# Patient Record
Sex: Female | Born: 1985
Health system: Southern US, Community
[De-identification: ages and names within clinical notes are randomized; demographics above are authoritative.]

## PROBLEM LIST (undated history)

## (undated) DIAGNOSIS — D649 Anemia, unspecified: Secondary | ICD-10-CM

## (undated) DIAGNOSIS — I10 Essential (primary) hypertension: Secondary | ICD-10-CM

## (undated) HISTORY — DX: Essential (primary) hypertension: I10

## (undated) HISTORY — PX: HERNIA REPAIR: SHX51

## (undated) HISTORY — DX: Anemia, unspecified: D64.9

## (undated) HISTORY — PX: TUBAL LIGATION: SHX77

---

## 2014-09-18 ENCOUNTER — Emergency Department (HOSPITAL_COMMUNITY)
Admission: EM | Admit: 2014-09-18 | Discharge: 2014-09-18 | Discharge status: Against Medical Advice | Payer: Self-pay | Attending: Emergency Medicine | Admitting: Emergency Medicine

## 2014-09-18 ENCOUNTER — Encounter (HOSPITAL_COMMUNITY): Payer: Self-pay | Admitting: Emergency Medicine

## 2014-09-18 DIAGNOSIS — Z720 Tobacco use: Secondary | ICD-10-CM | POA: Insufficient documentation

## 2014-09-18 DIAGNOSIS — R300 Dysuria: Secondary | ICD-10-CM | POA: Insufficient documentation

## 2014-09-18 DIAGNOSIS — N898 Other specified noninflammatory disorders of vagina: Secondary | ICD-10-CM | POA: Insufficient documentation

## 2014-09-18 NOTE — ED Notes (Signed)
Pt c/o dysuria, vaginal discharge, and lower abdominal pain starting last night.  Pain score 6/10.  Pt reports boyfriend was recently diagnosed w/ dysuria and urethritis.  Sts "I wanna be given what he was."

## 2014-09-18 NOTE — ED Notes (Signed)
No answer x 3 in WR 

## 2014-09-18 NOTE — ED Notes (Signed)
Called for second time without response 

## 2014-09-18 NOTE — ED Notes (Signed)
Called to take to a treatment room without response from lobby

## 2014-09-18 NOTE — ED Notes (Signed)
Called for third time without response from lobby 

## 2014-09-30 ENCOUNTER — Encounter (HOSPITAL_COMMUNITY): Payer: Self-pay

## 2014-09-30 ENCOUNTER — Emergency Department (HOSPITAL_COMMUNITY)
Admission: EM | Admit: 2014-09-30 | Discharge: 2014-10-01 | Discharge status: Against Medical Advice | Payer: Self-pay | Attending: Emergency Medicine | Admitting: Emergency Medicine

## 2014-09-30 DIAGNOSIS — Z202 Contact with and (suspected) exposure to infections with a predominantly sexual mode of transmission: Secondary | ICD-10-CM | POA: Insufficient documentation

## 2014-09-30 DIAGNOSIS — Z8679 Personal history of other diseases of the circulatory system: Secondary | ICD-10-CM | POA: Insufficient documentation

## 2014-09-30 DIAGNOSIS — Z720 Tobacco use: Secondary | ICD-10-CM | POA: Insufficient documentation

## 2014-09-30 DIAGNOSIS — R519 Headache, unspecified: Secondary | ICD-10-CM

## 2014-09-30 DIAGNOSIS — Z113 Encounter for screening for infections with a predominantly sexual mode of transmission: Secondary | ICD-10-CM

## 2014-09-30 DIAGNOSIS — Z3202 Encounter for pregnancy test, result negative: Secondary | ICD-10-CM | POA: Insufficient documentation

## 2014-09-30 DIAGNOSIS — R51 Headache: Secondary | ICD-10-CM | POA: Insufficient documentation

## 2014-09-30 LAB — COMPREHENSIVE METABOLIC PANEL
ALT: 11 U/L (ref 0–35)
ANION GAP: 12 (ref 5–15)
AST: 14 U/L (ref 0–37)
Albumin: 3.8 g/dL (ref 3.5–5.2)
Alkaline Phosphatase: 62 U/L (ref 39–117)
BUN: 21 mg/dL (ref 6–23)
CALCIUM: 9.4 mg/dL (ref 8.4–10.5)
CO2: 24 meq/L (ref 19–32)
Chloride: 104 mEq/L (ref 96–112)
Creatinine, Ser: 1.39 mg/dL — ABNORMAL HIGH (ref 0.50–1.10)
GFR calc Af Amer: 58 mL/min — ABNORMAL LOW (ref >90)
GFR, EST NON AFRICAN AMERICAN: 50 mL/min — AB (ref >90)
GLUCOSE: 83 mg/dL (ref 70–99)
Potassium: 4.1 mEq/L (ref 3.7–5.3)
Sodium: 140 mEq/L (ref 137–147)
TOTAL PROTEIN: 7.2 g/dL (ref 6.0–8.3)
Total Bilirubin: <0.2 mg/dL — ABNORMAL LOW (ref 0.3–1.2)

## 2014-09-30 LAB — CBC
HEMATOCRIT: 36.8 % (ref 36.0–46.0)
HEMOGLOBIN: 12.2 g/dL (ref 12.0–15.0)
MCH: 28.6 pg (ref 26.0–34.0)
MCHC: 33.2 g/dL (ref 30.0–36.0)
MCV: 86.2 fL (ref 78.0–100.0)
Platelets: 282 10*3/uL (ref 150–400)
RBC: 4.27 MIL/uL (ref 3.87–5.11)
RDW: 14.2 % (ref 11.5–15.5)
WBC: 9.7 10*3/uL (ref 4.0–10.5)

## 2014-09-30 LAB — POC URINE PREG, ED: Preg Test, Ur: NEGATIVE

## 2014-09-30 LAB — WET PREP, GENITAL
Clue Cells Wet Prep HPF POC: NONE SEEN
Trich, Wet Prep: NONE SEEN
Yeast Wet Prep HPF POC: NONE SEEN

## 2014-09-30 MED ORDER — METOCLOPRAMIDE HCL 5 MG/ML IJ SOLN
10.0000 mg | Freq: Once | INTRAMUSCULAR | Status: AC
  Administered 2014-09-30: 10 mg via INTRAVENOUS
  Filled 2014-09-30: qty 2

## 2014-09-30 MED ORDER — SODIUM CHLORIDE 0.9 % IV BOLUS (SEPSIS)
1000.0000 mL | Freq: Once | INTRAVENOUS | Status: AC
Start: 2014-09-30 — End: 2014-10-01
  Administered 2014-10-01: 1000 mL via INTRAVENOUS

## 2014-09-30 MED ORDER — AZITHROMYCIN 250 MG PO TABS
1000.0000 mg | ORAL_TABLET | Freq: Once | ORAL | Status: AC
  Administered 2014-09-30: 1000 mg via ORAL
  Filled 2014-09-30: qty 4

## 2014-09-30 MED ORDER — CEFTRIAXONE SODIUM 250 MG IJ SOLR
250.0000 mg | Freq: Once | INTRAMUSCULAR | Status: AC
  Administered 2014-09-30: 250 mg via INTRAMUSCULAR
  Filled 2014-09-30: qty 250

## 2014-09-30 MED ORDER — KETOROLAC TROMETHAMINE 30 MG/ML IJ SOLN: 30.0000 mg | Freq: Once | INTRAMUSCULAR | Status: DC

## 2014-09-30 MED ORDER — DIPHENHYDRAMINE HCL 50 MG/ML IJ SOLN
25.0000 mg | Freq: Once | INTRAMUSCULAR | Status: AC
  Administered 2014-09-30: 25 mg via INTRAVENOUS
  Filled 2014-09-30: qty 1

## 2014-09-30 MED ORDER — LIDOCAINE HCL 2 % IJ SOLN
INTRAMUSCULAR | Status: AC
  Administered 2014-09-30: 3 mL
  Filled 2014-09-30: qty 20

## 2014-09-30 NOTE — ED Notes (Signed)
Pt states she is currently unable to urinate. Says she will try again after pelvic exam.

## 2014-09-30 NOTE — ED Notes (Signed)
Pt states that her boyfriend was dx with gonorrhea and she wants to be checked, she states that she's having some abd pain and discharge, she also complains of a headache

## 2014-09-30 NOTE — ED Notes (Signed)
Pelvic supplies at bedside. 

## 2014-09-30 NOTE — ED Provider Notes (Signed)
CSN: [id number]     Arrival date & time 09/30/14  1923 History   First MD Initiated Contact with Patient 09/30/14 2255     Chief Complaint  Patient presents with  . Abdominal Pain  . Headache     (Consider location/radiation/quality/duration/timing/severity/associated sxs/prior Treatment) HPI Comments: Patient presents emergency department with chief complaint of an STD check and headache.  Headache: Patient states that the headache started about 3 days ago. She states it came on gradually and has progressively worsened. She reports a history of migraines. She states that this feels similar. She has tried OTC medicines with no relief. She denies any fevers, chills, nausea, or vomiting. There are no aggravating or alleviating factors. She is concerned that her blood pressure was running high.  STD check: Patient states that her boyfriend was recently diagnosed with gonorrhea. She states that she wants to be checked for the same. She reports small amount of vaginal discharge. She denies specific abdominal pain. She denies any fevers, chills, nausea, vomiting. She has not taken anything to alleviate the symptoms.  The history is provided by the patient. No language interpreter was used.    History reviewed. No pertinent past medical history. Past Surgical History  Procedure Laterality Date  . Hernia repair     History reviewed. No pertinent family history. History  Substance Use Topics  . Smoking status: Current Every Day Smoker -- 0.50 packs/day    Types: Cigarettes  . Smokeless tobacco: Not on file  . Alcohol Use: Yes     Comment: occ   OB History    No data available     Review of Systems  Constitutional: Negative for fever and chills.  Respiratory: Negative for shortness of breath.   Cardiovascular: Negative for chest pain.  Gastrointestinal: Negative for nausea, vomiting, diarrhea and constipation.  Genitourinary: Positive for vaginal discharge. Negative for dysuria.   Neurological: Positive for headaches.  All other systems reviewed and are negative.     Allergies  Review of patient's allergies indicates no known allergies.  Home Medications   Prior to Admission medications   Medication Sig Start Date End Date Taking? Authorizing Provider  ibuprofen (ADVIL,MOTRIN) 200 MG tablet Take 400 mg by mouth every 6 (six) hours as needed for headache.   Yes Historical Provider, MD  Pseudoeph-Doxylamine-DM-APAP (NYQUIL PO) Take 1 capsule by mouth at bedtime.   Yes Historical Provider, MD   BP 140/90 mmHg  Pulse 62  Temp(Src) 97.5 F (36.4 C) (Oral)  Resp 18  Ht 5\' 7"  (1.702 m)  Wt 170 lb (77.111 kg)  BMI 26.62 kg/m2  SpO2 100%  LMP 09/04/2014 Physical Exam  Constitutional: She is oriented to person, place, and time. She appears well-developed and well-nourished.  HENT:  Head: Normocephalic and atraumatic.  Right Ear: External ear normal.  Left Ear: External ear normal.  Eyes: Conjunctivae and EOM are normal. Pupils are equal, round, and reactive to light.  Neck: Normal range of motion. Neck supple.  No pain with neck flexion, no meningismus  Cardiovascular: Normal rate, regular rhythm and normal heart sounds.  Exam reveals no gallop and no friction rub.   No murmur heard. Pulmonary/Chest: Effort normal and breath sounds normal. No respiratory distress. She has no wheezes. She has no rales. She exhibits no tenderness.  Abdominal: Soft. She exhibits no distension and no mass. There is no tenderness. There is no rebound and no guarding.  Musculoskeletal: Normal range of motion. She exhibits no edema or tenderness.  Normal  gait.  Neurological: She is alert and oriented to person, place, and time. She has normal reflexes.  CN 3-12 intact, normal finger to nose, no pronator drift, sensation and strength intact bilaterally.  Skin: Skin is warm and dry.  Psychiatric: She has a normal mood and affect. Her behavior is normal. Judgment and thought content  normal.  Nursing note and vitals reviewed.   ED Course  Procedures (including critical care time) Labs Review Labs Reviewed  COMPREHENSIVE METABOLIC PANEL - Abnormal; Notable for the following:    Creatinine, Ser 1.39 (*)    Total Bilirubin <0.2 (*)    GFR calc non Af Amer 50 (*)    GFR calc Af Amer 58 (*)    All other components within normal limits  GC/CHLAMYDIA PROBE AMP  WET PREP, GENITAL  CBC  RPR  URINALYSIS, ROUTINE W REFLEX MICROSCOPIC  POC URINE PREG, ED    Imaging Review No results found.   EKG Interpretation None      MDM   Final diagnoses:  Nonintractable headache, unspecified chronicity pattern, unspecified headache type  Screen for STD (sexually transmitted disease)   Patient here for STD check. Also complaining of headache. Treat with migraine cocktail. Plan for treatment for STD, as the boyfriend was recently diagnosed gonorrhea.  11:45 PM Creatinine is mildly elevated, will hold Toradol.  12:33 AM Returned to re-evaluate patient.  Patient had eloped.  I was never notified of the patient leaving.    Montine Circle, PA-C 10/01/14 [phone]

## 2014-10-01 LAB — URINALYSIS, ROUTINE W REFLEX MICROSCOPIC
BILIRUBIN URINE: NEGATIVE
Glucose, UA: NEGATIVE mg/dL
Hgb urine dipstick: NEGATIVE
Ketones, ur: NEGATIVE mg/dL
Nitrite: NEGATIVE
PH: 6.5 (ref 5.0–8.0)
Protein, ur: NEGATIVE mg/dL
Specific Gravity, Urine: 1.022 (ref 1.005–1.030)
UROBILINOGEN UA: 0.2 mg/dL (ref 0.0–1.0)

## 2014-10-01 LAB — URINE MICROSCOPIC-ADD ON

## 2014-10-01 LAB — GC/CHLAMYDIA PROBE AMP
CT Probe RNA: NEGATIVE
GC PROBE AMP APTIMA: POSITIVE — AB

## 2014-10-01 LAB — RPR

## 2014-10-01 NOTE — ED Notes (Signed)
Pt decided to leave prior to proper discharge instructions.

## 2014-10-01 NOTE — ED Notes (Signed)
IV removed, pt states she is not willing to wait for discharge paperwork and left AMA

## 2014-10-02 ENCOUNTER — Telehealth: Payer: Self-pay | Admitting: Emergency Medicine

## 2014-10-02 NOTE — Telephone Encounter (Signed)
Positive Gonorrhea Treated with Rocephin and Zithromax per protocol MD [facility] faxed  10/02/14 @ 1225 - patient notified

## 2015-02-20 ENCOUNTER — Ambulatory Visit: Payer: Self-pay

## 2015-03-05 ENCOUNTER — Emergency Department (HOSPITAL_COMMUNITY)
Admission: EM | Admit: 2015-03-05 | Discharge: 2015-03-05 | Disposition: A | Discharge status: Home / Self Care | Payer: Self-pay | Attending: Emergency Medicine | Admitting: Emergency Medicine

## 2015-03-05 ENCOUNTER — Encounter (HOSPITAL_COMMUNITY): Payer: Self-pay

## 2015-03-05 DIAGNOSIS — H6122 Impacted cerumen, left ear: Secondary | ICD-10-CM | POA: Insufficient documentation

## 2015-03-05 DIAGNOSIS — Z720 Tobacco use: Secondary | ICD-10-CM | POA: Insufficient documentation

## 2015-03-05 DIAGNOSIS — R05 Cough: Secondary | ICD-10-CM | POA: Insufficient documentation

## 2015-03-05 NOTE — Discharge Instructions (Signed)
Cerumen Impaction °A cerumen impaction is when the wax in your ear forms a plug. This plug usually causes reduced hearing. Sometimes it also causes an earache or dizziness. Removing a cerumen impaction can be difficult and painful. The wax sticks to the ear canal. The canal is sensitive and bleeds easily. If you try to remove a heavy wax buildup with a cotton tipped swab, you may push it in further. °Irrigation with water, suction, and small ear curettes may be used to clear out the wax. If the impaction is fixed to the skin in the ear canal, ear drops may be needed for a few days to loosen the wax. People who build up a lot of wax frequently can use ear wax removal products available in your local drugstore. °SEEK MEDICAL CARE IF:  °You develop an earache, increased hearing loss, or marked dizziness. °Document Released: 12/23/2004 Document Revised: 02/07/2012 Document Reviewed: 02/12/2010 °[employer]® Patient Information ©2015 [employer]. This information is not intended to replace advice given to you by your health care provider. Make sure you discuss any questions you have with your health care provider. ° °

## 2015-03-05 NOTE — ED Notes (Signed)
Patient c/o left ear pain that radiates into the left side of her face x 1 week.

## 2015-03-05 NOTE — ED Provider Notes (Signed)
CSN: [id number]     Arrival date & time 03/05/15  1807 History   First MD Initiated Contact with Patient 03/05/15 1828     Chief Complaint  Patient presents with  . Otalgia   Patient is a 29 y.o. female presenting with ear pain. The history is provided by the patient. No language interpreter was used.  Otalgia Associated symptoms: cough    This chart was scribed for nurse practitioner Glendell Docker, NP working with Virgel Manifold, MD, by Thea Alken, ED Scribe. This patient was seen in room WTR8/WTR8 and the patient's care was started at 6:31 PM.  Theresa Arias is a 29 y.o. female who presents to the Emergency Department complaining of worsening left otalgia that began 1 week. Pt reports she's had otalgia for 1 week that worsened today. She states when lying on her left side, it feels as if left ear is draining. She reports associated cough but denies fever and sore throat.   History reviewed. No pertinent past medical history. Past Surgical History  Procedure Laterality Date  . Hernia repair     Family History  Problem Relation Age of Onset  . Hypertension Mother   . Hypertension Father    History  Substance Use Topics  . Smoking status: Current Every Day Smoker -- 0.50 packs/day    Types: Cigarettes  . Smokeless tobacco: Not on file  . Alcohol Use: Yes     Comment: occ   OB History    No data available     Review of Systems  HENT: Positive for ear pain.   Respiratory: Positive for cough.   All other systems reviewed and are negative.  Allergies  Review of patient's allergies indicates no known allergies.  Home Medications   Prior to Admission medications   Medication Sig Start Date End Date Taking? Authorizing Provider  ibuprofen (ADVIL,MOTRIN) 200 MG tablet Take 400 mg by mouth every 6 (six) hours as needed for headache.    Historical Provider, MD  Pseudoeph-Doxylamine-DM-APAP (NYQUIL PO) Take 1 capsule by mouth at bedtime.    Historical Provider, MD   BP  131/90 mmHg  Pulse 80  Temp(Src) 98.1 F (36.7 C) (Oral)  Resp 18  SpO2 98%  LMP 02/16/2015 Physical Exam  Constitutional: She is oriented to person, place, and time. She appears well-developed and well-nourished. No distress.  HENT:  Head: Normocephalic and atraumatic.  Right Ear: External ear normal.  Cerumen impaction noted to the left ear  Eyes: Conjunctivae and EOM are normal.  Neck: Neck supple.  Cardiovascular: Normal rate.   Pulmonary/Chest: Effort normal.  Musculoskeletal: Normal range of motion.  Neurological: She is alert and oriented to person, place, and time.  Skin: Skin is warm and dry.  Psychiatric: She has a normal mood and affect. Her behavior is normal.  Nursing note and vitals reviewed.   ED Course  Procedures  DIAGNOSTIC STUDIES: Oxygen Saturation is 98% on RA, normal by my interpretation.    COORDINATION OF CARE: 6:36 PM- Pt advised of plan for treatment left ear irrigation and pt agrees.  Labs Review Labs Reviewed - No data to display  Imaging Review No results found.   EKG Interpretation None      MDM   Final diagnoses:  Cerumen impaction, left    Nurse cleaned out tm clear. Discussed treatment at home  I personally performed the services described in this documentation, which was scribed in my presence. The recorded information has been reviewed and is accurate.  Glendell Docker, NP 03/05/15 1901  Virgel Manifold, MD 03/05/15 [phone]

## 2015-06-24 ENCOUNTER — Emergency Department (HOSPITAL_COMMUNITY)
Admission: EM | Admit: 2015-06-24 | Discharge: 2015-06-24 | Disposition: A | Discharge status: Home / Self Care | Payer: Self-pay | Attending: Emergency Medicine | Admitting: Emergency Medicine

## 2015-06-24 ENCOUNTER — Encounter (HOSPITAL_COMMUNITY): Payer: Self-pay | Admitting: Emergency Medicine

## 2015-06-24 DIAGNOSIS — N939 Abnormal uterine and vaginal bleeding, unspecified: Secondary | ICD-10-CM | POA: Insufficient documentation

## 2015-06-24 DIAGNOSIS — Z720 Tobacco use: Secondary | ICD-10-CM | POA: Insufficient documentation

## 2015-06-24 DIAGNOSIS — A5901 Trichomonal vulvovaginitis: Secondary | ICD-10-CM | POA: Insufficient documentation

## 2015-06-24 DIAGNOSIS — Z3202 Encounter for pregnancy test, result negative: Secondary | ICD-10-CM | POA: Insufficient documentation

## 2015-06-24 DIAGNOSIS — A599 Trichomoniasis, unspecified: Secondary | ICD-10-CM

## 2015-06-24 LAB — URINALYSIS, ROUTINE W REFLEX MICROSCOPIC
BILIRUBIN URINE: NEGATIVE
Glucose, UA: NEGATIVE mg/dL
Ketones, ur: NEGATIVE mg/dL
NITRITE: NEGATIVE
PH: 8 (ref 5.0–8.0)
Protein, ur: NEGATIVE mg/dL
Specific Gravity, Urine: 1.016 (ref 1.005–1.030)
UROBILINOGEN UA: 0.2 mg/dL (ref 0.0–1.0)

## 2015-06-24 LAB — CBC WITH DIFFERENTIAL/PLATELET
BASOS ABS: 0 10*3/uL (ref 0.0–0.1)
BASOS PCT: 0 % (ref 0–1)
Eosinophils Absolute: 0.4 10*3/uL (ref 0.0–0.7)
Eosinophils Relative: 4 % (ref 0–5)
HCT: 38.9 % (ref 36.0–46.0)
HEMOGLOBIN: 12.5 g/dL (ref 12.0–15.0)
LYMPHS ABS: 2 10*3/uL (ref 0.7–4.0)
Lymphocytes Relative: 20 % (ref 12–46)
MCH: 27.5 pg (ref 26.0–34.0)
MCHC: 32.1 g/dL (ref 30.0–36.0)
MCV: 85.7 fL (ref 78.0–100.0)
Monocytes Absolute: 0.7 10*3/uL (ref 0.1–1.0)
Monocytes Relative: 7 % (ref 3–12)
NEUTROS ABS: 6.8 10*3/uL (ref 1.7–7.7)
Neutrophils Relative %: 69 % (ref 43–77)
Platelets: 270 10*3/uL (ref 150–400)
RBC: 4.54 MIL/uL (ref 3.87–5.11)
RDW: 14.4 % (ref 11.5–15.5)
WBC: 9.9 10*3/uL (ref 4.0–10.5)

## 2015-06-24 LAB — BASIC METABOLIC PANEL
Anion gap: 7 (ref 5–15)
BUN: 13 mg/dL (ref 6–20)
CHLORIDE: 104 mmol/L (ref 101–111)
CO2: 24 mmol/L (ref 22–32)
CREATININE: 0.86 mg/dL (ref 0.44–1.00)
Calcium: 8.9 mg/dL (ref 8.9–10.3)
GFR calc non Af Amer: >60 mL/min (ref >60)
Glucose, Bld: 90 mg/dL (ref 65–99)
POTASSIUM: 4 mmol/L (ref 3.5–5.1)
Sodium: 135 mmol/L (ref 135–145)

## 2015-06-24 LAB — WET PREP, GENITAL: Yeast Wet Prep HPF POC: NONE SEEN

## 2015-06-24 LAB — URINE MICROSCOPIC-ADD ON

## 2015-06-24 LAB — POC URINE PREG, ED: Preg Test, Ur: NEGATIVE

## 2015-06-24 MED ORDER — METRONIDAZOLE 500 MG PO TABS
2000.0000 mg | ORAL_TABLET | Freq: Once | ORAL | Status: AC
  Administered 2015-06-24: 2000 mg via ORAL
  Filled 2015-06-24: qty 4

## 2015-06-24 MED ORDER — ONDANSETRON HCL 4 MG/2ML IJ SOLN
4.0000 mg | Freq: Once | INTRAMUSCULAR | Status: AC
  Administered 2015-06-24: 4 mg via INTRAVENOUS
  Filled 2015-06-24: qty 2

## 2015-06-24 MED ORDER — MORPHINE SULFATE 4 MG/ML IJ SOLN
4.0000 mg | Freq: Once | INTRAMUSCULAR | Status: AC
  Administered 2015-06-24: 4 mg via INTRAVENOUS
  Filled 2015-06-24: qty 1

## 2015-06-24 NOTE — ED Notes (Addendum)
Pt reports vaginal bleeding that started 2 days ago. LMP prior to this was 2 weeks ago. Pt also reports L side abd pain and upper abd swelling. Pt feels nauseous, but no v/d. No dysuria.

## 2015-06-24 NOTE — ED Provider Notes (Signed)
CSN: [id number]     Arrival date & time 06/24/15  1232 History   First MD Initiated Contact with Patient 06/24/15 1252     Chief Complaint  Patient presents with  . Vaginal Bleeding  . Abdominal Pain     (Consider location/radiation/quality/duration/timing/severity/associated sxs/prior Treatment) HPI Comments: Patient presents to the emergency department with chief complaint of vaginal bleeding 2 days. She states that her last menstrual period was 2 weeks ago. She states that her bleeding has been moderate. She denies any associated weakness, fatigue, dizziness, or shortness of breath. She states that she has felt nauseated, but denies any vomiting or diarrhea. She has not taken anything for her symptoms. There are no aggravating or alleviating factors. Patient also complains of a small mass above her belly button. She states this is been there for several months. She denies having associated pain.  The history is provided by the patient. No language interpreter was used.    History reviewed. No pertinent past medical history. Past Surgical History  Procedure Laterality Date  . Hernia repair     Family History  Problem Relation Age of Onset  . Hypertension Mother   . Hypertension Father    History  Substance Use Topics  . Smoking status: Current Every Day Smoker -- 0.50 packs/day    Types: Cigarettes  . Smokeless tobacco: Not on file  . Alcohol Use: Yes     Comment: occ   OB History    No data available     Review of Systems  Constitutional: Negative for fever and chills.  Respiratory: Negative for shortness of breath.   Cardiovascular: Negative for chest pain.  Gastrointestinal: Negative for nausea, vomiting, diarrhea and constipation.  Genitourinary: Positive for vaginal bleeding. Negative for dysuria.  All other systems reviewed and are negative.     Allergies  Review of patient's allergies indicates no known allergies.  Home Medications   Prior to Admission  medications   Medication Sig Start Date End Date Taking? Authorizing Provider  ibuprofen (ADVIL,MOTRIN) 200 MG tablet Take 400 mg by mouth every 6 (six) hours as needed for headache.   Yes Historical Provider, MD  naproxen sodium (ANAPROX) 220 MG tablet Take 440 mg by mouth every 12 (twelve) hours as needed (pain).   Yes Historical Provider, MD   BP 188/114 mmHg  Pulse 72  Temp(Src) 97.6 F (36.4 C) (Oral)  Resp 18  SpO2 100% Physical Exam  Constitutional: She is oriented to person, place, and time. She appears well-developed and well-nourished.  HENT:  Head: Normocephalic and atraumatic.  Eyes: Conjunctivae and EOM are normal. Pupils are equal, round, and reactive to light.  Neck: Normal range of motion. Neck supple.  Cardiovascular: Normal rate and regular rhythm.  Exam reveals no gallop and no friction rub.   No murmur heard. Pulmonary/Chest: Effort normal and breath sounds normal. No respiratory distress. She has no wheezes. She has no rales. She exhibits no tenderness.  Abdominal: Soft. Bowel sounds are normal. She exhibits no distension and no mass. There is no tenderness. There is no rebound and no guarding.  Genitourinary:  Pelvic exam chaperoned by female ER tech, performed by Sundra Aland, PA-S under my supervision, no right or left adnexal tenderness, no uterine tenderness, no vaginal discharge, no bleeding, old blood in vaginal vault, no CMT or friability, no foreign body, no injury to the external genitalia, no other significant findings   Musculoskeletal: Normal range of motion. She exhibits no edema or tenderness.  Neurological:  She is alert and oriented to person, place, and time.  Skin: Skin is warm and dry.  Psychiatric: She has a normal mood and affect. Her behavior is normal. Judgment and thought content normal.  Nursing note and vitals reviewed.   ED Course  Procedures (including critical care time) Results for orders placed or performed during the hospital  encounter of 06/24/15  Wet prep, genital  Result Value Ref Range   Yeast Wet Prep HPF POC NONE SEEN NONE SEEN   Trich, Wet Prep MODERATE (A) NONE SEEN   Clue Cells Wet Prep HPF POC FEW (A) NONE SEEN   WBC, Wet Prep HPF POC FEW (A) NONE SEEN  CBC with Differential/Platelet  Result Value Ref Range   WBC 9.9 4.0 - 10.5 K/uL   RBC 4.54 3.87 - 5.11 MIL/uL   Hemoglobin 12.5 12.0 - 15.0 g/dL   HCT 38.9 36.0 - 46.0 %   MCV 85.7 78.0 - 100.0 fL   MCH 27.5 26.0 - 34.0 pg   MCHC 32.1 30.0 - 36.0 g/dL   RDW 14.4 11.5 - 15.5 %   Platelets 270 150 - 400 K/uL   Neutrophils Relative % 69 43 - 77 %   Neutro Abs 6.8 1.7 - 7.7 K/uL   Lymphocytes Relative 20 12 - 46 %   Lymphs Abs 2.0 0.7 - 4.0 K/uL   Monocytes Relative 7 3 - 12 %   Monocytes Absolute 0.7 0.1 - 1.0 K/uL   Eosinophils Relative 4 0 - 5 %   Eosinophils Absolute 0.4 0.0 - 0.7 K/uL   Basophils Relative 0 0 - 1 %   Basophils Absolute 0.0 0.0 - 0.1 K/uL  Basic metabolic panel  Result Value Ref Range   Sodium 135 135 - 145 mmol/L   Potassium 4.0 3.5 - 5.1 mmol/L   Chloride 104 101 - 111 mmol/L   CO2 24 22 - 32 mmol/L   Glucose, Bld 90 65 - 99 mg/dL   BUN 13 6 - 20 mg/dL   Creatinine, Ser 0.86 0.44 - 1.00 mg/dL   Calcium 8.9 8.9 - 10.3 mg/dL   GFR calc non Af Amer >60 >60 mL/min   GFR calc Af Amer >60 >60 mL/min   Anion gap 7 5 - 15  Urinalysis, Routine w reflex microscopic (not at [facility])  Result Value Ref Range   Color, Urine YELLOW YELLOW   APPearance CLOUDY (A) CLEAR   Specific Gravity, Urine 1.016 1.005 - 1.030   pH 8.0 5.0 - 8.0   Glucose, UA NEGATIVE NEGATIVE mg/dL   Hgb urine dipstick TRACE (A) NEGATIVE   Bilirubin Urine NEGATIVE NEGATIVE   Ketones, ur NEGATIVE NEGATIVE mg/dL   Protein, ur NEGATIVE NEGATIVE mg/dL   Urobilinogen, UA 0.2 0.0 - 1.0 mg/dL   Nitrite NEGATIVE NEGATIVE   Leukocytes, UA SMALL (A) NEGATIVE  Urine microscopic-add on  Result Value Ref Range   Squamous Epithelial / LPF RARE RARE   WBC, UA 7-10  <3 WBC/hpf   Bacteria, UA MANY (A) RARE   Urine-Other MUCOUS PRESENT   POC urine preg, ED (not at [facility])  Result Value Ref Range   Preg Test, Ur NEGATIVE NEGATIVE   No results found.    EKG Interpretation None      MDM   Final diagnoses:  Abnormal uterine bleeding  Trichimoniasis    Patient with vaginal bleeding and some mild abdominal pain.  Will check pelvic, labs and reassess.  Wet prep remarkable for moderate trich.  Will treat with 2g of metronidazole.  UA remarkable for 7-10 wbc and many bacteria, but patient doesn't have UTI symptoms.  Will send urine for culture.  Will recommend PCP/OBGYN follow-up for abnormal uterine bleeding.  Pregnancy test is negative.  VSS.  Remaining labs are reassuring.  Patient advised to have her BP followed-up on by her PCP.    Filed Vitals:   06/24/15 1458  BP: 171/90  Pulse:   Temp:   Resp:         Montine Circle, PA-C 06/24/15 [place], MD 06/24/15 [phone]

## 2015-06-24 NOTE — Discharge Instructions (Signed)
Abnormal Uterine Bleeding Abnormal uterine bleeding can affect women at various stages in life, including teenagers, women in their reproductive years, pregnant women, and women who have reached menopause. Several kinds of uterine bleeding are considered abnormal, including:  Bleeding or spotting between periods.   Bleeding after sexual intercourse.   Bleeding that is heavier or more than normal.   Periods that last longer than usual.  Bleeding after menopause.  Many cases of abnormal uterine bleeding are minor and simple to treat, while others are more serious. Any type of abnormal bleeding should be evaluated by your health care provider. Treatment will depend on the cause of the bleeding. HOME CARE INSTRUCTIONS Monitor your condition for any changes. The following actions may help to alleviate any discomfort you are experiencing:  Avoid the use of tampons and douches as directed by your health care provider.  Change your pads frequently. You should get regular pelvic exams and Pap tests. Keep all follow-up appointments for diagnostic tests as directed by your health care provider.  SEEK MEDICAL CARE IF:   Your bleeding lasts more than 1 week.   You feel dizzy at times.  SEEK IMMEDIATE MEDICAL CARE IF:   You pass out.   You are changing pads every 15 to 30 minutes.   You have abdominal pain.  You have a fever.   You become sweaty or weak.   You are passing large blood clots from the vagina.   You start to feel nauseous and vomit. MAKE SURE YOU:   Understand these instructions.  Will watch your condition.  Will get help right away if you are not doing well or get worse. Document Released: 11/15/2005 Document Revised: 11/20/2013 Document Reviewed: 06/14/2013 [facility] Patient Information 2015 [place], [place]. This information is not intended to replace advice given to you by your health care provider. Make sure you discuss any questions you have with your  health care provider. Trichomoniasis Trichomoniasis is an infection caused by an organism called Trichomonas. The infection can affect both women and men. In women, the outer female genitalia and the vagina are affected. In men, the penis is mainly affected, but the prostate and other reproductive organs can also be involved. Trichomoniasis is a sexually transmitted infection (STI) and is most often passed to another person through sexual contact.  RISK FACTORS  Having unprotected sexual intercourse.  Having sexual intercourse with an infected partner. SIGNS AND SYMPTOMS  Symptoms of trichomoniasis in women include:  Abnormal gray-green frothy vaginal discharge.  Itching and irritation of the vagina.  Itching and irritation of the area outside the vagina. Symptoms of trichomoniasis in men include:   Penile discharge with or without pain.  Pain during urination. This results from inflammation of the urethra. DIAGNOSIS  Trichomoniasis may be found during a Pap test or physical exam. Your health care provider may use one of the following methods to help diagnose this infection:  Examining vaginal discharge under a microscope. For men, urethral discharge would be examined.  Testing the pH of the vagina with a test tape.  Using a vaginal swab test that checks for the Trichomonas organism. A test is available that provides results within a few minutes.  Doing a culture test for the organism. This is not usually needed. TREATMENT   You may be given medicine to fight the infection. Women should inform their health care provider if they could be or are pregnant. Some medicines used to treat the infection should not be taken during pregnancy.  Your health care provider may recommend over-the-counter medicines or creams to decrease itching or irritation.  Your sexual partner will need to be treated if infected. HOME CARE INSTRUCTIONS   Take medicines only as directed by your health care  provider.  Take over-the-counter medicine for itching or irritation as directed by your health care provider.  Do not have sexual intercourse while you have the infection.  Women should not douche or wear tampons while they have the infection.  Discuss your infection with your partner. Your partner may have gotten the infection from you, or you may have gotten it from your partner.  Have your sex partner get examined and treated if necessary.  Practice safe, informed, and protected sex.  See your health care provider for other STI testing. SEEK MEDICAL CARE IF:   You still have symptoms after you finish your medicine.  You develop abdominal pain.  You have pain when you urinate.  You have bleeding after sexual intercourse.  You develop a rash.  Your medicine makes you sick or makes you throw up (vomit). MAKE SURE YOU:  Understand these instructions.  Will watch your condition.  Will get help right away if you are not doing well or get worse. Document Released: 05/11/2001 Document Revised: 04/01/2014 Document Reviewed: 08/27/2013 [employer] Patient Information 2015 [place], [place]. This information is not intended to replace advice given to you by your health care provider. Make sure you discuss any questions you have with your health care provider.

## 2015-06-25 LAB — GC/CHLAMYDIA PROBE AMP (~~LOC~~) NOT AT ARMC
CHLAMYDIA, DNA PROBE: NEGATIVE
Neisseria Gonorrhea: NEGATIVE

## 2015-06-26 LAB — URINE CULTURE

## 2016-05-05 ENCOUNTER — Encounter (HOSPITAL_COMMUNITY): Payer: Self-pay | Admitting: Emergency Medicine

## 2016-05-05 ENCOUNTER — Emergency Department (HOSPITAL_COMMUNITY)
Admission: EM | Admit: 2016-05-05 | Discharge: 2016-05-05 | Disposition: A | Discharge status: Home / Self Care | Payer: Self-pay | Attending: Emergency Medicine | Admitting: Emergency Medicine

## 2016-05-05 DIAGNOSIS — Z791 Long term (current) use of non-steroidal anti-inflammatories (NSAID): Secondary | ICD-10-CM | POA: Insufficient documentation

## 2016-05-05 DIAGNOSIS — J029 Acute pharyngitis, unspecified: Secondary | ICD-10-CM | POA: Insufficient documentation

## 2016-05-05 DIAGNOSIS — F17210 Nicotine dependence, cigarettes, uncomplicated: Secondary | ICD-10-CM | POA: Insufficient documentation

## 2016-05-05 DIAGNOSIS — Z79899 Other long term (current) drug therapy: Secondary | ICD-10-CM | POA: Insufficient documentation

## 2016-05-05 LAB — RAPID STREP SCREEN (MED CTR MEBANE ONLY): Streptococcus, Group A Screen (Direct): NEGATIVE

## 2016-05-05 MED ORDER — IBUPROFEN 600 MG PO TABS: 600.0000 mg | ORAL_TABLET | Freq: Four times a day (QID) | ORAL | Status: DC | PRN

## 2016-05-05 MED ORDER — DEXAMETHASONE SODIUM PHOSPHATE 10 MG/ML IJ SOLN
10.0000 mg | Freq: Once | INTRAMUSCULAR | Status: AC
  Administered 2016-05-05: 10 mg via INTRAMUSCULAR
  Filled 2016-05-05: qty 1

## 2016-05-05 MED ORDER — MAGIC MOUTHWASH W/LIDOCAINE: 5.0000 mL | Freq: Four times a day (QID) | ORAL | Status: DC | PRN

## 2016-05-05 NOTE — ED Notes (Signed)
PT DISCHARGED. INSTRUCTIONS AND PRESCRIPTIONS GIVEN. AAOX4. PT IN NO APPARENT DISTRESS. THE OPPORTUNITY TO ASK QUESTIONS WAS PROVIDED. 

## 2016-05-05 NOTE — Discharge Instructions (Signed)
Continue ibuprofen and Tylenol for pain. Do saltwater gargles to help with swelling. Try Magic mouth wash, gargle it for pain relief. Make sure to drink plenty of fluids. Follow-up as needed.   Pharyngitis Pharyngitis is redness, pain, and swelling (inflammation) of your pharynx.  CAUSES  Pharyngitis is usually caused by infection. Most of the time, these infections are from viruses (viral) and are part of a cold. However, sometimes pharyngitis is caused by bacteria (bacterial). Pharyngitis can also be caused by allergies. Viral pharyngitis may be spread from person to person by coughing, sneezing, and personal items or utensils (cups, forks, spoons, toothbrushes). Bacterial pharyngitis may be spread from person to person by more intimate contact, such as kissing.  SIGNS AND SYMPTOMS  Symptoms of pharyngitis include:   Sore throat.   Tiredness (fatigue).   Low-grade fever.   Headache.  Joint pain and muscle aches.  Skin rashes.  Swollen lymph nodes.  Plaque-like film on throat or tonsils (often seen with bacterial pharyngitis). DIAGNOSIS  Your health care provider will ask you questions about your illness and your symptoms. Your medical history, along with a physical exam, is often all that is needed to diagnose pharyngitis. Sometimes, a rapid strep test is done. Other lab tests may also be done, depending on the suspected cause.  TREATMENT  Viral pharyngitis will usually get better in 3-4 days without the use of medicine. Bacterial pharyngitis is treated with medicines that kill germs (antibiotics).  HOME CARE INSTRUCTIONS   Drink enough water and fluids to keep your urine clear or pale yellow.   Only take over-the-counter or prescription medicines as directed by your health care provider:   If you are prescribed antibiotics, make sure you finish them even if you start to feel better.   Do not take aspirin.   Get lots of rest.   Gargle with 8 oz of salt water ( tsp  of salt per 1 qt of water) as often as every 1-2 hours to soothe your throat.   Throat lozenges (if you are not at risk for choking) or sprays may be used to soothe your throat. SEEK MEDICAL CARE IF:   You have large, tender lumps in your neck.  You have a rash.  You cough up green, yellow-brown, or bloody spit. SEEK IMMEDIATE MEDICAL CARE IF:   Your neck becomes stiff.  You drool or are unable to swallow liquids.  You vomit or are unable to keep medicines or liquids down.  You have severe pain that does not go away with the use of recommended medicines.  You have trouble breathing (not caused by a stuffy nose). MAKE SURE YOU:   Understand these instructions.  Will watch your condition.  Will get help right away if you are not doing well or get worse.   This information is not intended to replace advice given to you by your health care provider. Make sure you discuss any questions you have with your health care provider.   Document Released: 11/15/2005 Document Revised: 09/05/2013 Document Reviewed: 07/23/2013 Elsevier Interactive Patient Education [employer].

## 2016-05-05 NOTE — ED Provider Notes (Signed)
CSN: [id number]     Arrival date & time 05/05/16  1800 History   By signing my name below, I, Eustaquio Maize, attest that this documentation has been prepared under the direction and in the presence of Michala Deblanc, PA-C.  Electronically Signed: Eustaquio Maize, ED Scribe. 05/05/2016. 7:18 PM.    Chief Complaint  Patient presents with  . Sore Throat    The history is provided by the patient. No language interpreter was used.   HPI Comments: Theresa Arias is a 30 y.o. female who presents to the Emergency Department complaining of gradual onset, constant, sore throat onset of 3 days. Pt reports associated right ear pain only when swallowing, chills, and rhinorrhea. Pain is worse when swallowing food or liquid. Pt is able to tolerate own secretions. Pt has used OTC sore throat spray and Ibuprofen to no relief. She reports hx of seasonal allergies, but does not take medication for it. She reports no sick contact or any recent travel. Pt denies fever, body aches, cough, chest pain, shortness of breath, nausea, vomiting, diarrhea, constipation.    History reviewed. No pertinent past medical history. Past Surgical History  Procedure Laterality Date  . Hernia repair     Family History  Problem Relation Age of Onset  . Hypertension Mother   . Hypertension Father    Social History  Substance Use Topics  . Smoking status: Current Every Day Smoker -- 0.50 packs/day    Types: Cigarettes  . Smokeless tobacco: None  . Alcohol Use: Yes     Comment: occ   OB History    No data available     Review of Systems  Constitutional: Positive for chills. Negative for fever.  HENT: Positive for ear pain, rhinorrhea and sore throat. Negative for trouble swallowing.   Respiratory: Negative for cough and shortness of breath.   Gastrointestinal: Negative for nausea, vomiting and diarrhea.  Musculoskeletal: Negative for myalgias.   Allergies  Review of patient's allergies indicates no known  allergies.  Home Medications   Prior to Admission medications   Medication Sig Start Date End Date Taking? Authorizing Provider  ibuprofen (ADVIL,MOTRIN) 200 MG tablet Take 400 mg by mouth every 6 (six) hours as needed for headache.    Historical Provider, MD  naproxen sodium (ANAPROX) 220 MG tablet Take 440 mg by mouth every 12 (twelve) hours as needed (pain).    Historical Provider, MD   BP 154/105 mmHg  Pulse 70  Temp(Src) 98 F (36.7 C) (Oral)  SpO2 100%  LMP 05/05/2016 Physical Exam  Constitutional: She is oriented to person, place, and time. She appears well-developed and well-nourished. No distress.  HENT:  Head: Normocephalic and atraumatic.  right TM is normal, left TM obscured by cerumen. Tonsils are enlarged bilaterally. Erythematous. No exudate. Uvula is midline.   Eyes: Conjunctivae and EOM are normal.  Neck: Neck supple. No tracheal deviation present.  Tenderness over right anterior cervical lymph node chain, no enlarged lymph nodes palpated.  Cardiovascular: Normal rate, regular rhythm and normal heart sounds.   Pulmonary/Chest: Effort normal and breath sounds normal. No respiratory distress. She has no wheezes. She has no rales.  Musculoskeletal: Normal range of motion.  Lymphadenopathy:    She has no cervical adenopathy.  Neurological: She is alert and oriented to person, place, and time.  Skin: Skin is warm and dry.  Psychiatric: She has a normal mood and affect. Her behavior is normal.  Nursing note and vitals reviewed.   ED Course  Procedures (  including critical care time) DIAGNOSTIC STUDIES: Oxygen Saturation is 100% on RA, normal by my interpretation.    COORDINATION OF CARE: 7:07 PM-Discussed treatment plan which includes Rapid strep screen with pt at bedside and pt agreed to plan.    Labs Review Labs Reviewed  RAPID STREP SCREEN (NOT AT [facility])   I have personally reviewed and evaluated these lab results as part of my medical decision-making.    EKG Interpretation None      MDM   Final diagnoses:  Pharyngitis   Patient emergency department with sore throat for 3 days. She denies any fever, no cough. Her tonsils are bilaterally enlarged, uvula midline, no concern for peritonsillar abscess. She is not having difficulty swallowing other than pain. Rapid strep is negative. Her symptoms are most consistent with viral pharyngitis at this time. Will treat symptomatically. Patient did request Decadron shot for inflammation. 10mg  of Decadron given IM prior to discharge. Return precautions discussed    Filed Vitals:   05/05/16 1835  BP: 154/105  Pulse: 70  Temp: 98 F (36.7 C)  TempSrc: Oral  SpO2: 100%    I personally performed the services described in this documentation, which was scribed in my presence. The recorded information has been reviewed and is accurate.    Jeannett Senior, PA-C 05/05/16 1940  Lacretia Leigh, MD 05/05/16 2018

## 2016-05-05 NOTE — ED Notes (Signed)
Patient presents for sore throat x3 days. Painful to swallow. Denies fever or difficulty swallowing own secretions.

## 2016-05-07 LAB — CULTURE, GROUP A STREP (THRC)

## 2016-05-08 ENCOUNTER — Telehealth (HOSPITAL_BASED_OUTPATIENT_CLINIC_OR_DEPARTMENT_OTHER): Payer: Self-pay

## 2016-05-08 NOTE — Progress Notes (Signed)
ED Antimicrobial Stewardship Positive Culture Follow Up   Theresa Arias is an 30 y.o. female who presented to [facility] on 05/05/2016 with a chief complaint of  Chief Complaint  Patient presents with  . Sore Throat    Recent Results (from the past 720 hour(s))  Rapid strep screen     Status: None   Collection Time: 05/05/16  6:40 PM  Result Value Ref Range Status   Streptococcus, Group A Screen (Direct) NEGATIVE NEGATIVE Final    Comment: (NOTE) A Rapid Antigen test may result negative if the antigen level in the sample is below the detection level of this test. The FDA has not cleared this test as a stand-alone test therefore the rapid antigen negative result has reflexed to a Group A Strep culture.   Culture, group A strep     Status: None   Collection Time: 05/05/16  6:40 PM  Result Value Ref Range Status   Specimen Description THROAT  Final   Special Requests NONE Reflexed from [id number]  Final   Culture FEW GROUP A STREP (S.PYOGENES) ISOLATED  Final   Report Status 05/07/2016 FINAL  Final     [x]  Patient discharged originally without antimicrobial agent and treatment is now indicated  New antibiotic prescription: Amoxicillin 500 mg PO BID x 10 days  ED Provider: Donnald Garre, PA-C  Tasmia Blumer L. Nicole Kindred, PharmD PGY2 Infectious Diseases Pharmacy Resident Pager: [phone] 05/08/2016 9:27 AM

## 2016-05-08 NOTE — Telephone Encounter (Signed)
Post ED Visit - Positive Culture Follow-up: Unsuccessful Patient Follow-up  Culture assessed and recommendations reviewed by: []  Elenor Quinones, Pharm.D. []  Heide Guile, Pharm.D., BCPS []  Parks Neptune, Pharm.D. []  Alycia Rossetti, Pharm.D., BCPS []  [place], Pharm.D., BCPS, AAHIVP []  Legrand Como, Pharm.D., BCPS, AAHIVP [x]  Milus Glazier, Pharm.D. []  Stephens November, Pharm.D.  Positive throat culture  [x]  Patient discharged without antimicrobial prescription and treatment is now indicated []  Organism is resistant to prescribed ED discharge antimicrobial []  Patient with positive blood cultures   Unable to contact patient after 3 attempts, letter will be sent to address on file  Genia Del 05/08/2016, 10:02 AM

## 2016-05-17 ENCOUNTER — Telehealth (HOSPITAL_BASED_OUTPATIENT_CLINIC_OR_DEPARTMENT_OTHER): Payer: Self-pay | Admitting: Emergency Medicine

## 2016-05-17 NOTE — Telephone Encounter (Signed)
Letter returned, no forwarding address, lost to followup 

## 2016-09-25 ENCOUNTER — Encounter (HOSPITAL_COMMUNITY): Payer: Self-pay | Admitting: Emergency Medicine

## 2016-09-25 ENCOUNTER — Emergency Department (HOSPITAL_COMMUNITY)
Admission: EM | Admit: 2016-09-25 | Discharge: 2016-09-25 | Disposition: A | Discharge status: Home / Self Care | Payer: Self-pay | Attending: Emergency Medicine | Admitting: Emergency Medicine

## 2016-09-25 DIAGNOSIS — Z79899 Other long term (current) drug therapy: Secondary | ICD-10-CM | POA: Insufficient documentation

## 2016-09-25 DIAGNOSIS — F17210 Nicotine dependence, cigarettes, uncomplicated: Secondary | ICD-10-CM | POA: Insufficient documentation

## 2016-09-25 DIAGNOSIS — H60502 Unspecified acute noninfective otitis externa, left ear: Secondary | ICD-10-CM

## 2016-09-25 DIAGNOSIS — H6502 Acute serous otitis media, left ear: Secondary | ICD-10-CM | POA: Insufficient documentation

## 2016-09-25 DIAGNOSIS — H6092 Unspecified otitis externa, left ear: Secondary | ICD-10-CM | POA: Insufficient documentation

## 2016-09-25 MED ORDER — AMOXICILLIN-POT CLAVULANATE 875-125 MG PO TABS
1.0000 | ORAL_TABLET | Freq: Once | ORAL | Status: AC
  Administered 2016-09-25: 1 via ORAL
  Filled 2016-09-25: qty 1

## 2016-09-25 MED ORDER — IBUPROFEN 800 MG PO TABS
800.0000 mg | ORAL_TABLET | Freq: Three times a day (TID) | ORAL | 0 refills | Status: DC
Start: 2016-09-25 — End: 2018-01-26

## 2016-09-25 MED ORDER — CARBAMIDE PEROXIDE 6.5 % OT SOLN
5.0000 [drp] | Freq: Once | OTIC | Status: AC
  Administered 2016-09-25: 5 [drp] via OTIC
  Filled 2016-09-25: qty 15

## 2016-09-25 MED ORDER — NEOMYCIN-POLYMYXIN-HC 3.5-10000-1 OT SUSP
4.0000 [drp] | Freq: Four times a day (QID) | OTIC | 0 refills | Status: AC
Start: 2016-09-25 — End: 2016-10-02

## 2016-09-25 MED ORDER — AMOXICILLIN-POT CLAVULANATE 875-125 MG PO TABS: 1.0000 | ORAL_TABLET | Freq: Two times a day (BID) | ORAL | 0 refills | Status: DC

## 2016-09-25 NOTE — ED Provider Notes (Signed)
[facility] DEPT Provider Note   CSN: [id number] Arrival date & time: 09/25/16  1032     History   Chief Complaint Chief Complaint  Patient presents with  . Otalgia    HPI Theresa Arias is a 30 y.o. female.  Patient is a 30 year old female with history of cerumen impaction in the left ear, presenting with chief complaint of left ear pain starting 4 days ago. Describes pain as constant, throbbing, and worse with eating. Denies any pain to right ear, fevers, chills, changes in hearing, or sore throat. Also reports runny nose over the past 4 days, but no other complaints. No history of prior ear infections or TM tubes as a child. Reports "high blood pressure," but does not take hypertensive medications and has no PCP.      History reviewed. No pertinent past medical history.  There are no active problems to display for this patient.   Past Surgical History:  Procedure Laterality Date  . HERNIA REPAIR      OB History    No data available       Home Medications    Prior to Admission medications   Medication Sig Start Date End Date Taking? Authorizing Provider  ibuprofen (ADVIL,MOTRIN) 200 MG tablet Take 400 mg by mouth every 6 (six) hours as needed for headache.    Historical Provider, MD  ibuprofen (ADVIL,MOTRIN) 600 MG tablet Take 1 tablet (600 mg total) by mouth every 6 (six) hours as needed. 05/05/16   Tatyana Kirichenko, PA-C  magic mouthwash w/lidocaine SOLN Take 5 mLs by mouth 4 (four) times daily as needed for mouth pain. May gargle to help with throat pain 05/05/16   Tatyana Kirichenko, PA-C  naproxen sodium (ANAPROX) 220 MG tablet Take 440 mg by mouth every 12 (twelve) hours as needed (pain).    Historical Provider, MD    Family History Family History  Problem Relation Age of Onset  . Hypertension Mother   . Hypertension Father     Social History Social History  Substance Use Topics  . Smoking status: Current Every Day Smoker    Packs/day: 0.50   Types: Cigarettes  . Smokeless tobacco: Never Used  . Alcohol use Yes     Comment: occ     Allergies   Review of patient's allergies indicates no known allergies.   Review of Systems Review of Systems  Constitutional: Negative for chills and fever.  HENT: Positive for ear pain and rhinorrhea. Negative for congestion, ear discharge, facial swelling, hearing loss, sinus pressure and sore throat.   Respiratory: Negative for cough.   Musculoskeletal: Negative for neck pain.  Skin: Negative for color change and rash.  Neurological: Negative for dizziness and headaches.  Hematological: Negative for adenopathy.     Physical Exam Updated Vital Signs BP (!) 172/112 (BP Location: Right Arm)   Pulse 73   Temp 98.1 F (36.7 C) (Oral)   Resp 18   SpO2 100%   Physical Exam  Constitutional: She appears well-developed and well-nourished. No distress.  HENT:  Head: Normocephalic and atraumatic.  Mouth/Throat: Oropharynx is clear and moist.  Cerumen impaction noted to left ear. After disimpaction, left EAC noted to be erythematous, with bulging TM and evidence of effusion. Right TM and ear canal normal. No nasal mucosal edema or erythema. No oropharyngeal exudate, erythema, or edema.  Eyes: Conjunctivae are normal.  Neck: Normal range of motion.  Cardiovascular: Normal rate.   Pulmonary/Chest: Effort normal. No respiratory distress.  Musculoskeletal: Normal range of  motion.  Neurological: She is alert.  Skin: Skin is warm and dry.  Psychiatric: She has a normal mood and affect.  Nursing note and vitals reviewed.    ED Treatments / Results  Labs (all labs ordered are listed, but only abnormal results are displayed) Labs Reviewed - No data to display  EKG  EKG Interpretation None       Radiology No results found.  Procedures .Ear Cerumen Removal Date/Time: 09/25/2016 1:44 PM Performed by: DE Quentin Mulling F Authorized by: Soyla Dryer F   Consent:     Consent obtained:  Verbal   Consent given by:  Patient   Risks discussed:  Pain, incomplete removal and TM perforation   Alternatives discussed:  No treatment Procedure details:    Location:  L ear   Procedure type: irrigation   Post-procedure details:    Inspection:  TM intact   Hearing quality:  Normal   Patient tolerance of procedure:  Tolerated well, no immediate complications Comments:     After disimpaction of cerumen, EAC noted to be erythematous with bulging TM and effusion.   (including critical care time)  Medications Ordered in ED Medications - No data to display   Initial Impression / Assessment and Plan / ED Course  I have reviewed the triage vital signs and the nursing notes.  Pertinent labs & imaging results that were available during my care of the patient were reviewed by me and considered in my medical decision making (see chart for details).  Clinical Course   Patient is 30 yo F presenting with left ear pain starting 4 days ago. Cerumen impaction noted to left ear. After disimpaction, clinical evidence of otitis externa and serous otitis media. Patient is afebrile, notably hypertensive, but asymptomatic. Patient evaluated by attending physician, Dr. Nat Christen, who agreed with assessment and plan to d/c home with 7 day course of Augmentin and Cortisporin. Given contact information to establish care with [facility] for reevaluation and management of hypertension. Discussed return precautions to ED for worsening pain, changes in hearing, fevers, chills, sore throat, difficulty swallowing, or neck pain.  Final Clinical Impressions(s) / ED Diagnoses   Final diagnoses:  Acute otitis externa of left ear, unspecified type  Acute serous otitis media of left ear, recurrence not specified    New Prescriptions Discharge Medication List as of 09/25/2016 12:59 PM    START taking these medications   Details  amoxicillin-clavulanate (AUGMENTIN) 875-125 MG  tablet Take 1 tablet by mouth 2 (two) times daily., Starting Sat 09/25/2016, Print    neomycin-polymyxin-hydrocortisone (CORTISPORIN) 3.5-10000-1 otic suspension Place 4 drops into the left ear 4 (four) times daily., Starting Sat 09/25/2016, Until Sat 10/02/2016, Print         Stacie Glaze [place], [place] 09/25/16 1437    Nat Christen, MD 09/26/16 1118

## 2016-09-25 NOTE — ED Triage Notes (Signed)
Patient c/o left ear pain x couple days. Patient states that hurts when she chews or sleeps.

## 2016-09-25 NOTE — ED Notes (Signed)
Bed: WTR7 Expected date:  Expected time:  Means of arrival:  Comments: 

## 2016-10-01 MED FILL — AMOX TR-K CLV 875-125 MG TA: 875-125 | 7 days supply | Qty: 14 | Fill #0

## 2016-10-07 ENCOUNTER — Inpatient Hospital Stay: Payer: Self-pay

## 2016-10-10 ENCOUNTER — Encounter (HOSPITAL_COMMUNITY): Payer: Self-pay | Admitting: Emergency Medicine

## 2016-10-10 ENCOUNTER — Emergency Department (HOSPITAL_COMMUNITY)
Admission: EM | Admit: 2016-10-10 | Discharge: 2016-10-10 | Disposition: A | Discharge status: Home / Self Care | Payer: Self-pay | Attending: Emergency Medicine | Admitting: Emergency Medicine

## 2016-10-10 DIAGNOSIS — H6690 Otitis media, unspecified, unspecified ear: Secondary | ICD-10-CM

## 2016-10-10 DIAGNOSIS — H6692 Otitis media, unspecified, left ear: Secondary | ICD-10-CM | POA: Insufficient documentation

## 2016-10-10 DIAGNOSIS — F17210 Nicotine dependence, cigarettes, uncomplicated: Secondary | ICD-10-CM | POA: Insufficient documentation

## 2016-10-10 MED ORDER — AMOXICILLIN 400 MG/5ML PO SUSR: 500.0000 mg | Freq: Three times a day (TID) | ORAL | 0 refills | Status: AC

## 2016-10-10 NOTE — Discharge Instructions (Signed)
You may wish to try some mineral oil in the ear to help soothe pain. Take amoxicillin as directed until gone.  Recommend to eat with this to prevent any stomach upset. Return here for new concerns.

## 2016-10-10 NOTE — ED Triage Notes (Signed)
Patient here with complaints of left ear pain for over 2 weeks. States that she was seen for same here, told if pain was not better to come back.

## 2016-10-10 NOTE — ED Provider Notes (Signed)
[facility] DEPT Provider Note   CSN: [id number] Arrival date & time: 10/10/16  1133  By signing my name below, I, Hansel Feinstein, attest that this documentation has been prepared under the direction and in the presence of  Quincy Carnes, PA-C. Electronically Signed: Hansel Feinstein, ED Scribe. 10/10/16. 12:17 PM.    History   Chief Complaint Chief Complaint  Patient presents with  . Otalgia    HPI Theresa Arias is a 30 y.o. female who presents to the Emergency Department complaining of moderate, throbbing, persistent left ear pain for 3 weeks. Pt has previously been treated for the same symptoms with Cortisporin and Augmentin, which have not provided her relief as she was not able to afford the drops and the pills caused her diarrhea. She also states the Augmentin was too large for her to swallow. Only took 2 doses of meds thus far.  No prior h/o difficulty swallowing pills. No h/o recurrent ear infections. She denies drainage from the ear, right ear pain, fever.  Hearing has been normal.  The history is provided by the patient. No language interpreter was used.    History reviewed. No pertinent past medical history.  There are no active problems to display for this patient.   Past Surgical History:  Procedure Laterality Date  . HERNIA REPAIR      OB History    No data available       Home Medications    Prior to Admission medications   Medication Sig Start Date End Date Taking? Authorizing Provider  amoxicillin-clavulanate (AUGMENTIN) 875-125 MG tablet Take 1 tablet by mouth 2 (two) times daily. 09/25/16   Daryl F de Villier II, PA  ibuprofen (ADVIL,MOTRIN) 800 MG tablet Take 1 tablet (800 mg total) by mouth 3 (three) times daily. 09/25/16   Daryl F de Villier II, PA  magic mouthwash w/lidocaine SOLN Take 5 mLs by mouth 4 (four) times daily as needed for mouth pain. May gargle to help with throat pain 05/05/16   Tatyana Kirichenko, PA-C  naproxen sodium (ANAPROX) 220 MG  tablet Take 440 mg by mouth every 12 (twelve) hours as needed (pain).    Historical Provider, MD    Family History Family History  Problem Relation Age of Onset  . Hypertension Mother   . Hypertension Father     Social History Social History  Substance Use Topics  . Smoking status: Current Every Day Smoker    Packs/day: 0.50    Types: Cigarettes  . Smokeless tobacco: Never Used  . Alcohol use Yes     Comment: occ     Allergies   Patient has no known allergies.   Review of Systems Review of Systems  Constitutional: Negative for fever.  HENT: Positive for ear pain. Negative for ear discharge.   All other systems reviewed and are negative.    Physical Exam Updated Vital Signs BP (!) 142/102 (BP Location: Right Arm)   Pulse 76   Temp 98.1 F (36.7 C) (Oral)   Resp 18   SpO2 99%   Physical Exam  Constitutional: She is oriented to person, place, and time. She appears well-developed and well-nourished.  HENT:  Head: Normocephalic and atraumatic.  Mouth/Throat: Oropharynx is clear and moist.  Left EAC and TM are erythematous; no bulging or signs of rupture; no drainage; mastoids non-tender Oropharynx clear  Eyes: Conjunctivae and EOM are normal. Pupils are equal, round, and reactive to light.  Neck: Normal range of motion.  Cardiovascular: Normal rate, regular rhythm  and normal heart sounds.   Pulmonary/Chest: Effort normal and breath sounds normal.  Abdominal: Soft. Bowel sounds are normal.  Musculoskeletal: Normal range of motion.  Neurological: She is alert and oriented to person, place, and time.  Skin: Skin is warm and dry.  Psychiatric: She has a normal mood and affect.  Nursing note and vitals reviewed.    ED Treatments / Results    DIAGNOSTIC STUDIES: Oxygen Saturation is 99% on RA, normal by my interpretation.    COORDINATION OF CARE: 12:06 PM Discussed treatment plan with pt at bedside which includes liquid amoxicillin and pt agreed to plan.      Labs (all labs ordered are listed, but only abnormal results are displayed) Labs Reviewed - No data to display  EKG  EKG Interpretation None       Radiology No results found.  Procedures Procedures (including critical care time)  Medications Ordered in ED Medications - No data to display   Initial Impression / Assessment and Plan / ED Course  I have reviewed the triage vital signs and the nursing notes.  Pertinent labs & imaging results that were available during my care of the patient were reviewed by me and considered in my medical decision making (see chart for details).  Clinical Course    30 year old female here with left ear pain. She was diagnosed with otitis media a few days ago and was prescribed Augmentin and Cortisporin, however could not afford the eardrops and was having difficulty taking the Augmentin due to pill size and side effects of diarrhea. Reports continued left ear pain. No fever or chills. She is afebrile and nontoxic here. Left EAC and TM remain erythematous without bulging or signs of TM rupture. No drainage.  Mastoids are non-tender. Will switch her to liquid amoxicillin.  Recommended to take antibiotics with food to prevent GI upset. Also by she may wish to try some mineral oil in her ear to help with pain since she could not afford the ear drops. Discussed plan with patient, she acknowledged understanding and agreed with plan of care.  Return precautions given for new or worsening symptoms.  Final Clinical Impressions(s) / ED Diagnoses   Final diagnoses:  Ear infection    New Prescriptions New Prescriptions   AMOXICILLIN (AMOXIL) 400 MG/5ML SUSPENSION    Take 6.3 mLs (500 mg total) by mouth 3 (three) times daily.    I personally performed the services described in this documentation, which was scribed in my presence. The recorded information has been reviewed and is accurate.   Larene Pickett, PA-C 10/10/16 Jermyn Liu,  MD 10/10/16 2126

## 2016-10-10 NOTE — ED Notes (Signed)
Pt was given 2 different Rx during last ED visit for antibiotic eardrops.  Pt states that she was unable to fill either one due to cost.  She was also unable to take her oral antibiotics due to the "size of the pill and they give me diarrhea after every pill".  Pt has taken a total of 2 antibiotic pills.

## 2016-10-11 ENCOUNTER — Encounter: Payer: Self-pay | Admitting: Family Medicine

## 2016-10-11 ENCOUNTER — Ambulatory Visit: Payer: Self-pay | Attending: Family Medicine | Admitting: Family Medicine

## 2016-10-11 VITALS — BP 150/100 | HR 62 | Temp 98.0°F | Resp 20 | Ht 72.0 in | Wt 175.0 lb

## 2016-10-11 DIAGNOSIS — H6592 Unspecified nonsuppurative otitis media, left ear: Secondary | ICD-10-CM

## 2016-10-11 DIAGNOSIS — H6692 Otitis media, unspecified, left ear: Secondary | ICD-10-CM | POA: Insufficient documentation

## 2016-10-11 DIAGNOSIS — I10 Essential (primary) hypertension: Secondary | ICD-10-CM | POA: Insufficient documentation

## 2016-10-11 MED ORDER — HYDROCHLOROTHIAZIDE 25 MG PO TABS: 25.0000 mg | ORAL_TABLET | Freq: Every day | ORAL | 3 refills | Status: DC

## 2016-10-11 NOTE — Progress Notes (Signed)
Subjective:  Patient ID: Theresa Arias, female    DOB: 26-Sep-1986  Age: 30 y.o. MRN: [id number]  CC: ED follow-up  HPI Theresa Arias comes into the clinic to establish care. Theresa Arias was seen at [facility] ED for left otitis media and was found to have elevated blood pressure there. Theresa Arias was given a prescription for Amoxil as Theresa Arias had been unable to tolerate Augmentin which Theresa Arias received 2 weeks ago due to side effects of diarrhea and large pill size. Theresa Arias presents today yet to fill her prescription due to financial constraints.  Does have residual left ear pain which is minimal but denies fever. Theresa Arias is also congested We've adjusted her blood pressure this is not new for her but Theresa Arias has never been treated.  No past medical history on file.  Past Surgical History:  Procedure Laterality Date  . HERNIA REPAIR      No Known Allergies   Outpatient Medications Prior to Visit  Medication Sig Dispense Refill  . amoxicillin (AMOXIL) 400 MG/5ML suspension Take 6.3 mLs (500 mg total) by mouth 3 (three) times daily. 200 mL 0  . ibuprofen (ADVIL,MOTRIN) 800 MG tablet Take 1 tablet (800 mg total) by mouth 3 (three) times daily. 21 tablet 0  . naproxen sodium (ANAPROX) 220 MG tablet Take 440 mg by mouth every 12 (twelve) hours as needed (pain).    Marland Kitchen amoxicillin-clavulanate (AUGMENTIN) 875-125 MG tablet Take 1 tablet by mouth 2 (two) times daily. (Patient not taking: Reported on 10/11/2016) 14 tablet 0  . magic mouthwash w/lidocaine SOLN Take 5 mLs by mouth 4 (four) times daily as needed for mouth pain. May gargle to help with throat pain (Patient not taking: Reported on 10/11/2016) 60 mL 0   No facility-administered medications prior to visit.     ROS Review of Systems  Constitutional: Negative for activity change and appetite change.  HENT: Positive for ear pain. Negative for sinus pressure and sore throat.   Respiratory: Negative for chest tightness, shortness of breath and wheezing.     Cardiovascular: Negative for chest pain and palpitations.  Gastrointestinal: Negative for abdominal distention, abdominal pain and constipation.  Genitourinary: Negative.   Musculoskeletal: Negative.   Psychiatric/Behavioral: Negative for behavioral problems and dysphoric mood.    Objective:  BP (!) 150/100   Pulse 62   Temp 98 F (36.7 C) (Oral)   Resp 20   Ht 6' (1.829 m)   Wt 175 lb (79.4 kg)   LMP 10/11/2016   SpO2 99%   BMI 23.73 kg/m   BP/Weight 10/11/2016 10/10/2016 0000000  Systolic BP [id number]  Diastolic BP [id number]  Wt. (Lbs) 175 - -  BMI 23.73 - -      Physical Exam  Constitutional: Theresa Arias is oriented to person, place, and time. Theresa Arias appears well-developed and well-nourished.  HENT:  Right Ear: External ear normal.  Cerumen obscuring the left TM No trageal tenderness bilaterally  Cardiovascular: Normal rate, normal heart sounds and intact distal pulses.   No murmur heard. Pulmonary/Chest: Effort normal and breath sounds normal. Theresa Arias has no wheezes. Theresa Arias has no rales. Theresa Arias exhibits no tenderness.  Abdominal: Soft. Bowel sounds are normal. Theresa Arias exhibits no distension and no mass. There is no tenderness.  Musculoskeletal: Normal range of motion.  Neurological: Theresa Arias is alert and oriented to person, place, and time.     Assessment & Plan:   1. Essential hypertension Uncontrolled Placed on hydrochlorothiazide Discussed low-sodium diet, lifestyle modifications Reassess blood pressure at  next visit  2. Left non-suppurative otitis media Patient referred to the pharmacy in-house for filling of her prescriptions as it will be more cost effective for her here   Meds ordered this encounter  Medications  . hydrochlorothiazide (HYDRODIURIL) 25 MG tablet    Sig: Take 1 tablet (25 mg total) by mouth daily.    Dispense:  30 tablet    Refill:  3    Follow-up: Return in about 1 month (around 11/10/2016) for Follow-up on hypertension.   Arnoldo Morale MD

## 2016-10-11 NOTE — Progress Notes (Signed)
Hospital f/u ear infection. Nasal spray for congestion  Amoxicillin-clavulanate had diarrhea. Has to get amoxil suspension filled.

## 2016-10-11 NOTE — Patient Instructions (Signed)
Hypertension Hypertension, commonly called high blood pressure, is when the force of blood pumping through your arteries is too strong. Your arteries are the blood vessels that carry blood from your heart throughout your body. A blood pressure reading consists of a higher number over a lower number, such as 110/72. The higher number (systolic) is the pressure inside your arteries when your heart pumps. The lower number (diastolic) is the pressure inside your arteries when your heart relaxes. Ideally you want your blood pressure below 120/80. Hypertension forces your heart to work harder to pump blood. Your arteries may become narrow or stiff. Having untreated or uncontrolled hypertension can cause heart attack, stroke, kidney disease, and other problems. RISK FACTORS Some risk factors for high blood pressure are controllable. Others are not.  Risk factors you cannot control include:   Race. You may be at higher risk if you are African American.  Age. Risk increases with age.  Gender. Men are at higher risk than women before age 45 years. After age 65, women are at higher risk than men. Risk factors you can control include:  Not getting enough exercise or physical activity.  Being overweight.  Getting too much fat, sugar, calories, or salt in your diet.  Drinking too much alcohol. SIGNS AND SYMPTOMS Hypertension does not usually cause signs or symptoms. Extremely high blood pressure (hypertensive crisis) may cause headache, anxiety, shortness of breath, and nosebleed. DIAGNOSIS To check if you have hypertension, your health care provider will measure your blood pressure while you are seated, with your arm held at the level of your heart. It should be measured at least twice using the same arm. Certain conditions can cause a difference in blood pressure between your right and left arms. A blood pressure reading that is higher than normal on one occasion does not mean that you need treatment. If  it is not clear whether you have high blood pressure, you may be asked to return on a different day to have your blood pressure checked again. Or, you may be asked to monitor your blood pressure at home for 1 or more weeks. TREATMENT Treating high blood pressure includes making lifestyle changes and possibly taking medicine. Living a healthy lifestyle can help lower high blood pressure. You may need to change some of your habits. Lifestyle changes may include:  Following the DASH diet. This diet is high in fruits, vegetables, and whole grains. It is low in salt, red meat, and added sugars.  Keep your sodium intake below 2,300 mg per day.  Getting at least 30-45 minutes of aerobic exercise at least 4 times per week.  Losing weight if necessary.  Not smoking.  Limiting alcoholic beverages.  Learning ways to reduce stress. Your health care provider may prescribe medicine if lifestyle changes are not enough to get your blood pressure under control, and if one of the following is true:  You are 18-59 years of age and your systolic blood pressure is above 140.  You are 60 years of age or older, and your systolic blood pressure is above 150.  Your diastolic blood pressure is above 90.  You have diabetes, and your systolic blood pressure is over 140 or your diastolic blood pressure is over 90.  You have kidney disease and your blood pressure is above 140/90.  You have heart disease and your blood pressure is above 140/90. Your personal target blood pressure may vary depending on your medical conditions, your age, and other factors. HOME CARE INSTRUCTIONS    Have your blood pressure rechecked as directed by your health care provider.   Take medicines only as directed by your health care provider. Follow the directions carefully. Blood pressure medicines must be taken as prescribed. The medicine does not work as well when you skip doses. Skipping doses also puts you at risk for  problems.  Do not smoke.   Monitor your blood pressure at home as directed by your health care provider. SEEK MEDICAL CARE IF:   You think you are having a reaction to medicines taken.  You have recurrent headaches or feel dizzy.  You have swelling in your ankles.  You have trouble with your vision. SEEK IMMEDIATE MEDICAL CARE IF:  You develop a severe headache or confusion.  You have unusual weakness, numbness, or feel faint.  You have severe chest or abdominal pain.  You vomit repeatedly.  You have trouble breathing. MAKE SURE YOU:   Understand these instructions.  Will watch your condition.  Will get help right away if you are not doing well or get worse.   This information is not intended to replace advice given to you by your health care provider. Make sure you discuss any questions you have with your health care provider.   Document Released: 11/15/2005 Document Revised: 04/01/2015 Document Reviewed: 09/07/2013 Elsevier Interactive Patient Education 2016 Elsevier Inc.  

## 2017-11-01 ENCOUNTER — Emergency Department (HOSPITAL_COMMUNITY)
Admission: EM | Admit: 2017-11-01 | Discharge: 2017-11-01 | Disposition: A | Discharge status: Home / Self Care | Payer: Self-pay

## 2017-11-01 NOTE — ED Notes (Signed)
Pt called for triage x2 

## 2018-01-26 ENCOUNTER — Ambulatory Visit (INDEPENDENT_AMBULATORY_CARE_PROVIDER_SITE_OTHER): Payer: Self-pay | Admitting: Physician Assistant

## 2018-01-26 ENCOUNTER — Encounter (INDEPENDENT_AMBULATORY_CARE_PROVIDER_SITE_OTHER): Payer: Self-pay | Admitting: Physician Assistant

## 2018-01-26 VITALS — BP 99/67 | HR 67 | Temp 97.7°F | Resp 18 | Ht 70.0 in | Wt 172.0 lb

## 2018-01-26 DIAGNOSIS — Z23 Encounter for immunization: Secondary | ICD-10-CM

## 2018-01-26 DIAGNOSIS — I10 Essential (primary) hypertension: Secondary | ICD-10-CM

## 2018-01-26 DIAGNOSIS — Z114 Encounter for screening for human immunodeficiency virus [HIV]: Secondary | ICD-10-CM

## 2018-01-26 MED ORDER — HYDROCHLOROTHIAZIDE 25 MG PO TABS: 25.0000 mg | ORAL_TABLET | Freq: Every day | ORAL | 1 refills | Status: DC

## 2018-01-26 MED ORDER — AMLODIPINE BESYLATE 5 MG PO TABS: 5.0000 mg | ORAL_TABLET | Freq: Every day | ORAL | 3 refills | Status: DC

## 2018-01-26 MED ORDER — ASPIRIN EC 81 MG PO TBEC: 81.0000 mg | DELAYED_RELEASE_TABLET | Freq: Every day | ORAL | 3 refills | Status: DC

## 2018-01-26 NOTE — Progress Notes (Signed)
Subjective:  Patient ID: Theresa Arias, female    DOB: 18-Dec-1985  Age: 32 y.o. MRN: [id number]  CC: HTN  HPI Theresa Arias is a 32 y.o. female with a medical history of BTL presents to establish care for her HTN. Says she is taking clonidine [facility]. Says her BP was usually 171/141 previously. Feeling fatigued, sleepy, and has mental dullness when taking clonidine. Has been prescribed HCTZ before but never took it. Does not endorse CP, palpitations, SOB, HA, abdominal pain, tingling, numbness, weakness, rash, swelling, or GI/GU sxs.    Outpatient Medications Prior to Visit  Medication Sig Dispense Refill  . cloNIDine (CATAPRES) 0.2 MG tablet Take 0.2 mg by mouth 2 (two) times daily.    . hydrochlorothiazide (HYDRODIURIL) 25 MG tablet Take 1 tablet (25 mg total) by mouth daily. 30 tablet 3  . ibuprofen (ADVIL,MOTRIN) 800 MG tablet Take 1 tablet (800 mg total) by mouth 3 (three) times daily. (Patient not taking: Reported on 01/26/2018) 21 tablet 0  . magic mouthwash w/lidocaine SOLN Take 5 mLs by mouth 4 (four) times daily as needed for mouth pain. May gargle to help with throat pain (Patient not taking: Reported on 10/11/2016) 60 mL 0  . naproxen sodium (ANAPROX) 220 MG tablet Take 440 mg by mouth every 12 (twelve) hours as needed (pain).    Marland Kitchen amoxicillin-clavulanate (AUGMENTIN) 875-125 MG tablet Take 1 tablet by mouth 2 (two) times daily. (Patient not taking: Reported on 10/11/2016) 14 tablet 0   No facility-administered medications prior to visit.      ROS Review of Systems  Constitutional: Positive for malaise/fatigue. Negative for chills and fever.  Eyes: Negative for blurred vision.  Respiratory: Negative for shortness of breath.   Cardiovascular: Negative for chest pain and palpitations.  Gastrointestinal: Negative for abdominal pain and nausea.  Genitourinary: Negative for dysuria and hematuria.  Musculoskeletal: Negative for joint pain and myalgias.   Skin: Negative for rash.  Neurological: Negative for tingling and headaches.  Psychiatric/Behavioral: Negative for depression. The patient is not nervous/anxious.     Objective:  BP 99/67 (BP Location: Left Arm, Patient Position: Sitting, Cuff Size: Normal)   Pulse 67   Temp 97.7 F (36.5 C) (Oral)   Resp 18   Ht 5\' 10"  (1.778 m)   Wt 172 lb (78 kg)   LMP 01/14/2018   SpO2 99%   BMI 24.68 kg/m   BP/Weight 01/26/2018 10/11/2016 35/59/7416  Systolic BP 99 384 536  Diastolic BP 67 468 032  Wt. (Lbs) 172 175 -  BMI 24.68 23.73 -      Physical Exam  Constitutional: She is oriented to person, place, and time.  Well developed, well nourished, NAD, polite  HENT:  Head: Normocephalic and atraumatic.  Eyes: No scleral icterus.  Neck: Normal range of motion. Neck supple. No thyromegaly present.  Cardiovascular: Normal rate, regular rhythm and normal heart sounds.  No carotid bruit bilaterally  Pulmonary/Chest: Effort normal and breath sounds normal.  Abdominal: Bowel sounds are normal.  Musculoskeletal: She exhibits no edema.  Neurological: She is alert and oriented to person, place, and time. No cranial nerve deficit. Coordination normal.  Skin: Skin is warm and dry. No rash noted. No erythema. No pallor.  Psychiatric: She has a normal mood and affect. Her behavior is normal. Thought content normal.  Vitals reviewed.    Assessment & Plan:   1. Hypertension, unspecified type - Stop Clonidine 0.2 mg BID - Begin HCTZ 25 mg qam  and amlodipine 5 mg qday - CBC with Differential - Comprehensive metabolic panel - TSH - Lipid panel; Future  2. Screening for HIV - HIV antibody  3. Need for Tdap vaccination - Administered Tdap in clinic today.   Meds ordered this encounter  Medications  . hydrochlorothiazide (HYDRODIURIL) 25 MG tablet    Sig: Take 1 tablet (25 mg total) by mouth daily. Take on tablet in the morning.    Dispense:  90 tablet    Refill:  1    Order  Specific Question:   Supervising Provider    Answer:   Tresa Garter [id number]  . amLODipine (NORVASC) 5 MG tablet    Sig: Take 1 tablet (5 mg total) by mouth daily.    Dispense:  90 tablet    Refill:  3    Order Specific Question:   Supervising Provider    Answer:   Tresa Garter [id number]  . aspirin EC 81 MG tablet    Sig: Take 1 tablet (81 mg total) by mouth daily.    Dispense:  90 tablet    Refill:  3    Order Specific Question:   Supervising Provider    Answer:   Tresa Garter [[id number]]    Follow-up:    Clent Demark PA

## 2018-01-26 NOTE — Patient Instructions (Signed)
DASH Eating Plan DASH stands for "Dietary Approaches to Stop Hypertension." The DASH eating plan is a healthy eating plan that has been shown to reduce high blood pressure (hypertension). It may also reduce your risk for type 2 diabetes, heart disease, and stroke. The DASH eating plan may also help with weight loss. What are tips for following this plan? General guidelines  Avoid eating more than 2,300 mg (milligrams) of salt (sodium) a day. If you have hypertension, you may need to reduce your sodium intake to 1,500 mg a day.  Limit alcohol intake to no more than 1 drink a day for nonpregnant women and 2 drinks a day for men. One drink equals 12 oz of beer, 5 oz of wine, or 1 oz of hard liquor.  Work with your health care provider to maintain a healthy body weight or to lose weight. Ask what an ideal weight is for you.  Get at least 30 minutes of exercise that causes your heart to beat faster (aerobic exercise) most days of the week. Activities may include walking, swimming, or biking.  Work with your health care provider or diet and nutrition specialist (dietitian) to adjust your eating plan to your individual calorie needs. Reading food labels  Check food labels for the amount of sodium per serving. Choose foods with less than 5 percent of the Daily Value of sodium. Generally, foods with less than 300 mg of sodium per serving fit into this eating plan.  To find whole grains, look for the word "whole" as the first word in the ingredient list. Shopping  Buy products labeled as "low-sodium" or "no salt added."  Buy fresh foods. Avoid canned foods and premade or frozen meals. Cooking  Avoid adding salt when cooking. Use salt-free seasonings or herbs instead of table salt or sea salt. Check with your health care provider or pharmacist before using salt substitutes.  Do not fry foods. Cook foods using healthy methods such as baking, boiling, grilling, and broiling instead.  Cook with  heart-healthy oils, such as olive, canola, soybean, or sunflower oil. Meal planning   Eat a balanced diet that includes: ? 5 or more servings of fruits and vegetables each day. At each meal, try to fill half of your plate with fruits and vegetables. ? Up to 6-8 servings of whole grains each day. ? Less than 6 oz of lean meat, poultry, or fish each day. A 3-oz serving of meat is about the same size as a deck of cards. One egg equals 1 oz. ? 2 servings of low-fat dairy each day. ? A serving of nuts, seeds, or beans 5 times each week. ? Heart-healthy fats. Healthy fats called Omega-3 fatty acids are found in foods such as flaxseeds and coldwater fish, like sardines, salmon, and mackerel.  Limit how much you eat of the following: ? Canned or prepackaged foods. ? Food that is high in trans fat, such as fried foods. ? Food that is high in saturated fat, such as fatty meat. ? Sweets, desserts, sugary drinks, and other foods with added sugar. ? Full-fat dairy products.  Do not salt foods before eating.  Try to eat at least 2 vegetarian meals each week.  Eat more home-cooked food and less restaurant, buffet, and fast food.  When eating at a restaurant, ask that your food be prepared with less salt or no salt, if possible. What foods are recommended? The items listed may not be a complete list. Talk with your dietitian about what   dietary choices are best for you. Grains Whole-grain or whole-wheat bread. Whole-grain or whole-wheat pasta. Brown rice. Oatmeal. Quinoa. Bulgur. Whole-grain and low-sodium cereals. Pita bread. Low-fat, low-sodium crackers. Whole-wheat flour tortillas. Vegetables Fresh or frozen vegetables (raw, steamed, roasted, or grilled). Low-sodium or reduced-sodium tomato and vegetable juice. Low-sodium or reduced-sodium tomato sauce and tomato paste. Low-sodium or reduced-sodium canned vegetables. Fruits All fresh, dried, or frozen fruit. Canned fruit in natural juice (without  added sugar). Meat and other protein foods Skinless chicken or turkey. Ground chicken or turkey. Pork with fat trimmed off. Fish and seafood. Egg whites. Dried beans, peas, or lentils. Unsalted nuts, nut butters, and seeds. Unsalted canned beans. Lean cuts of beef with fat trimmed off. Low-sodium, lean deli meat. Dairy Low-fat (1%) or fat-free (skim) milk. Fat-free, low-fat, or reduced-fat cheeses. Nonfat, low-sodium ricotta or cottage cheese. Low-fat or nonfat yogurt. Low-fat, low-sodium cheese. Fats and oils Soft margarine without trans fats. Vegetable oil. Low-fat, reduced-fat, or light mayonnaise and salad dressings (reduced-sodium). Canola, safflower, olive, soybean, and sunflower oils. Avocado. Seasoning and other foods Herbs. Spices. Seasoning mixes without salt. Unsalted popcorn and pretzels. Fat-free sweets. What foods are not recommended? The items listed may not be a complete list. Talk with your dietitian about what dietary choices are best for you. Grains Baked goods made with fat, such as croissants, muffins, or some breads. Dry pasta or rice meal packs. Vegetables Creamed or fried vegetables. Vegetables in a cheese sauce. Regular canned vegetables (not low-sodium or reduced-sodium). Regular canned tomato sauce and paste (not low-sodium or reduced-sodium). Regular tomato and vegetable juice (not low-sodium or reduced-sodium). Pickles. Olives. Fruits Canned fruit in a light or heavy syrup. Fried fruit. Fruit in cream or butter sauce. Meat and other protein foods Fatty cuts of meat. Ribs. Fried meat. Bacon. Sausage. Bologna and other processed lunch meats. Salami. Fatback. Hotdogs. Bratwurst. Salted nuts and seeds. Canned beans with added salt. Canned or smoked fish. Whole eggs or egg yolks. Chicken or turkey with skin. Dairy Whole or 2% milk, cream, and half-and-half. Whole or full-fat cream cheese. Whole-fat or sweetened yogurt. Full-fat cheese. Nondairy creamers. Whipped toppings.  Processed cheese and cheese spreads. Fats and oils Butter. Stick margarine. Lard. Shortening. Ghee. Bacon fat. Tropical oils, such as coconut, palm kernel, or palm oil. Seasoning and other foods Salted popcorn and pretzels. Onion salt, garlic salt, seasoned salt, table salt, and sea salt. Worcestershire sauce. Tartar sauce. Barbecue sauce. Teriyaki sauce. Soy sauce, including reduced-sodium. Steak sauce. Canned and packaged gravies. Fish sauce. Oyster sauce. Cocktail sauce. Horseradish that you find on the shelf. Ketchup. Mustard. Meat flavorings and tenderizers. Bouillon cubes. Hot sauce and Tabasco sauce. Premade or packaged marinades. Premade or packaged taco seasonings. Relishes. Regular salad dressings. Where to find more information:  National Heart, Lung, and Blood Institute: www.nhlbi.nih.gov  American Heart Association: www.heart.org Summary  The DASH eating plan is a healthy eating plan that has been shown to reduce high blood pressure (hypertension). It may also reduce your risk for type 2 diabetes, heart disease, and stroke.  With the DASH eating plan, you should limit salt (sodium) intake to 2,300 mg a day. If you have hypertension, you may need to reduce your sodium intake to 1,500 mg a day.  When on the DASH eating plan, aim to eat more fresh fruits and vegetables, whole grains, lean proteins, low-fat dairy, and heart-healthy fats.  Work with your health care provider or diet and nutrition specialist (dietitian) to adjust your eating plan to your individual   calorie needs. This information is not intended to replace advice given to you by your health care provider. Make sure you discuss any questions you have with your health care provider. Document Released: 11/04/2011 Document Revised: 11/08/2016 Document Reviewed: 11/08/2016 Elsevier Interactive Patient Education  2018 Elsevier Inc.  

## 2018-01-27 ENCOUNTER — Telehealth (INDEPENDENT_AMBULATORY_CARE_PROVIDER_SITE_OTHER): Payer: Self-pay | Admitting: *Deleted

## 2018-01-27 LAB — CBC WITH DIFFERENTIAL/PLATELET
Basophils Absolute: 0 10*3/uL (ref 0.0–0.2)
Basos: 0 %
EOS (ABSOLUTE): 0.3 10*3/uL (ref 0.0–0.4)
EOS: 4 %
Hematocrit: 37.4 % (ref 34.0–46.6)
Hemoglobin: 12 g/dL (ref 11.1–15.9)
IMMATURE GRANULOCYTES: 0 %
Immature Grans (Abs): 0 10*3/uL (ref 0.0–0.1)
Lymphocytes Absolute: 2.6 10*3/uL (ref 0.7–3.1)
Lymphs: 34 %
MCH: 28.1 pg (ref 26.6–33.0)
MCHC: 32.1 g/dL (ref 31.5–35.7)
MCV: 88 fL (ref 79–97)
MONOS ABS: 0.6 10*3/uL (ref 0.1–0.9)
Monocytes: 7 %
NEUTROS PCT: 55 %
Neutrophils Absolute: 4.2 10*3/uL (ref 1.4–7.0)
Platelets: 246 10*3/uL (ref 150–379)
RBC: 4.27 x10E6/uL (ref 3.77–5.28)
RDW: 14.2 % (ref 12.3–15.4)
WBC: 7.7 10*3/uL (ref 3.4–10.8)

## 2018-01-27 LAB — COMPREHENSIVE METABOLIC PANEL
A/G RATIO: 1.5 (ref 1.2–2.2)
ALT: 18 IU/L (ref 0–32)
AST: 16 IU/L (ref 0–40)
Albumin: 4 g/dL (ref 3.5–5.5)
Alkaline Phosphatase: 53 IU/L (ref 39–117)
BUN/Creatinine Ratio: 15 (ref 9–23)
BUN: 15 mg/dL (ref 6–20)
Bilirubin Total: <0.2 mg/dL (ref 0.0–1.2)
CALCIUM: 9.1 mg/dL (ref 8.7–10.2)
CO2: 22 mmol/L (ref 20–29)
Chloride: 103 mmol/L (ref 96–106)
Creatinine, Ser: 0.99 mg/dL (ref 0.57–1.00)
GFR calc Af Amer: 87 mL/min/{1.73_m2} (ref >59)
GFR, EST NON AFRICAN AMERICAN: 75 mL/min/{1.73_m2} (ref >59)
GLOBULIN, TOTAL: 2.7 g/dL (ref 1.5–4.5)
Glucose: 95 mg/dL (ref 65–99)
POTASSIUM: 4.3 mmol/L (ref 3.5–5.2)
SODIUM: 140 mmol/L (ref 134–144)
TOTAL PROTEIN: 6.7 g/dL (ref 6.0–8.5)

## 2018-01-27 LAB — HIV ANTIBODY (ROUTINE TESTING W REFLEX): HIV Screen 4th Generation wRfx: NONREACTIVE

## 2018-01-27 LAB — TSH: TSH: 1.77 u[IU]/mL (ref 0.450–4.500)

## 2018-01-27 MED FILL — AMLODIPINE BESYLATE 5 MG TA: 5 | 30 days supply | Qty: 30 | Fill #0

## 2018-01-27 MED FILL — HYDROCHLOROTHIAZIDE 25 MG T: 25 | 30 days supply | Qty: 30 | Fill #0

## 2018-01-27 NOTE — Telephone Encounter (Signed)
-----   Message from Clent Demark, PA-C sent at 01/27/2018  1:41 PM EST ----- Labs completely normal.

## 2018-01-27 NOTE — Telephone Encounter (Signed)
Patient verified DOB Patient is aware of labs being normal. No further questions. 

## 2018-02-06 ENCOUNTER — Ambulatory Visit (INDEPENDENT_AMBULATORY_CARE_PROVIDER_SITE_OTHER): Payer: Self-pay | Admitting: Physician Assistant

## 2018-03-28 ENCOUNTER — Telehealth (INDEPENDENT_AMBULATORY_CARE_PROVIDER_SITE_OTHER): Payer: Self-pay | Admitting: Physician Assistant

## 2018-03-28 NOTE — Telephone Encounter (Signed)
FWD to PCP. Tempestt S Roberts, CMA  

## 2018-03-28 NOTE — Telephone Encounter (Signed)
Called every number in the patients chart. [phone] does not belong to patient. All other numbers ring several times before disconnecting. Nat Christen, CMA

## 2018-03-28 NOTE — Telephone Encounter (Signed)
Please call patient and tell her to stop HCTZ. Follow up with me afterwards.

## 2018-03-28 NOTE — Telephone Encounter (Signed)
Pt called to state that her BP medications have been causing cramps in her legs and arms, an appointment was made to better evaluate the medication reaction. Please follow up

## 2018-03-29 NOTE — Telephone Encounter (Signed)
Called patient but she was not available. Nat Christen, CMA

## 2018-03-30 ENCOUNTER — Encounter (INDEPENDENT_AMBULATORY_CARE_PROVIDER_SITE_OTHER): Payer: Self-pay

## 2018-03-30 NOTE — Telephone Encounter (Signed)
Letter printed but not mailed. Patient called back and was informed to stop taking HCTZ. Nat Christen, CMA

## 2018-04-05 ENCOUNTER — Ambulatory Visit (INDEPENDENT_AMBULATORY_CARE_PROVIDER_SITE_OTHER): Payer: Self-pay | Admitting: Nurse Practitioner

## 2018-04-05 ENCOUNTER — Encounter (INDEPENDENT_AMBULATORY_CARE_PROVIDER_SITE_OTHER): Payer: Self-pay | Admitting: Nurse Practitioner

## 2018-04-05 VITALS — BP 114/74 | HR 77 | Temp 97.4°F | Resp 18 | Ht 73.0 in | Wt 167.0 lb

## 2018-04-05 DIAGNOSIS — R222 Localized swelling, mass and lump, trunk: Secondary | ICD-10-CM

## 2018-04-05 DIAGNOSIS — R252 Cramp and spasm: Secondary | ICD-10-CM

## 2018-04-05 DIAGNOSIS — I10 Essential (primary) hypertension: Secondary | ICD-10-CM

## 2018-04-05 NOTE — Patient Instructions (Signed)
DASH Eating Plan DASH stands for "Dietary Approaches to Stop Hypertension." The DASH eating plan is a healthy eating plan that has been shown to reduce high blood pressure (hypertension). It may also reduce your risk for type 2 diabetes, heart disease, and stroke. The DASH eating plan may also help with weight loss. What are tips for following this plan? General guidelines  Avoid eating more than 2,300 mg (milligrams) of salt (sodium) a day. If you have hypertension, you may need to reduce your sodium intake to 1,500 mg a day.  Limit alcohol intake to no more than 1 drink a day for nonpregnant women and 2 drinks a day for men. One drink equals 12 oz of beer, 5 oz of wine, or 1 oz of hard liquor.  Work with your health care provider to maintain a healthy body weight or to lose weight. Ask what an ideal weight is for you.  Get at least 30 minutes of exercise that causes your heart to beat faster (aerobic exercise) most days of the week. Activities may include walking, swimming, or biking.  Work with your health care provider or diet and nutrition specialist (dietitian) to adjust your eating plan to your individual calorie needs. Reading food labels  Check food labels for the amount of sodium per serving. Choose foods with less than 5 percent of the Daily Value of sodium. Generally, foods with less than 300 mg of sodium per serving fit into this eating plan.  To find whole grains, look for the word "whole" as the first word in the ingredient list. Shopping  Buy products labeled as "low-sodium" or "no salt added."  Buy fresh foods. Avoid canned foods and premade or frozen meals. Cooking  Avoid adding salt when cooking. Use salt-free seasonings or herbs instead of table salt or sea salt. Check with your health care provider or pharmacist before using salt substitutes.  Do not fry foods. Cook foods using healthy methods such as baking, boiling, grilling, and broiling instead.  Cook with  heart-healthy oils, such as olive, canola, soybean, or sunflower oil. Meal planning   Eat a balanced diet that includes: ? 5 or more servings of fruits and vegetables each day. At each meal, try to fill half of your plate with fruits and vegetables. ? Up to 6-8 servings of whole grains each day. ? Less than 6 oz of lean meat, poultry, or fish each day. A 3-oz serving of meat is about the same size as a deck of cards. One egg equals 1 oz. ? 2 servings of low-fat dairy each day. ? A serving of nuts, seeds, or beans 5 times each week. ? Heart-healthy fats. Healthy fats called Omega-3 fatty acids are found in foods such as flaxseeds and coldwater fish, like sardines, salmon, and mackerel.  Limit how much you eat of the following: ? Canned or prepackaged foods. ? Food that is high in trans fat, such as fried foods. ? Food that is high in saturated fat, such as fatty meat. ? Sweets, desserts, sugary drinks, and other foods with added sugar. ? Full-fat dairy products.  Do not salt foods before eating.  Try to eat at least 2 vegetarian meals each week.  Eat more home-cooked food and less restaurant, buffet, and fast food.  When eating at a restaurant, ask that your food be prepared with less salt or no salt, if possible. What foods are recommended? The items listed may not be a complete list. Talk with your dietitian about what   dietary choices are best for you. Grains Whole-grain or whole-wheat bread. Whole-grain or whole-wheat pasta. Brown rice. Oatmeal. Quinoa. Bulgur. Whole-grain and low-sodium cereals. Pita bread. Low-fat, low-sodium crackers. Whole-wheat flour tortillas. Vegetables Fresh or frozen vegetables (raw, steamed, roasted, or grilled). Low-sodium or reduced-sodium tomato and vegetable juice. Low-sodium or reduced-sodium tomato sauce and tomato paste. Low-sodium or reduced-sodium canned vegetables. Fruits All fresh, dried, or frozen fruit. Canned fruit in natural juice (without  added sugar). Meat and other protein foods Skinless chicken or turkey. Ground chicken or turkey. Pork with fat trimmed off. Fish and seafood. Egg whites. Dried beans, peas, or lentils. Unsalted nuts, nut butters, and seeds. Unsalted canned beans. Lean cuts of beef with fat trimmed off. Low-sodium, lean deli meat. Dairy Low-fat (1%) or fat-free (skim) milk. Fat-free, low-fat, or reduced-fat cheeses. Nonfat, low-sodium ricotta or cottage cheese. Low-fat or nonfat yogurt. Low-fat, low-sodium cheese. Fats and oils Soft margarine without trans fats. Vegetable oil. Low-fat, reduced-fat, or light mayonnaise and salad dressings (reduced-sodium). Canola, safflower, olive, soybean, and sunflower oils. Avocado. Seasoning and other foods Herbs. Spices. Seasoning mixes without salt. Unsalted popcorn and pretzels. Fat-free sweets. What foods are not recommended? The items listed may not be a complete list. Talk with your dietitian about what dietary choices are best for you. Grains Baked goods made with fat, such as croissants, muffins, or some breads. Dry pasta or rice meal packs. Vegetables Creamed or fried vegetables. Vegetables in a cheese sauce. Regular canned vegetables (not low-sodium or reduced-sodium). Regular canned tomato sauce and paste (not low-sodium or reduced-sodium). Regular tomato and vegetable juice (not low-sodium or reduced-sodium). Pickles. Olives. Fruits Canned fruit in a light or heavy syrup. Fried fruit. Fruit in cream or butter sauce. Meat and other protein foods Fatty cuts of meat. Ribs. Fried meat. Bacon. Sausage. Bologna and other processed lunch meats. Salami. Fatback. Hotdogs. Bratwurst. Salted nuts and seeds. Canned beans with added salt. Canned or smoked fish. Whole eggs or egg yolks. Chicken or turkey with skin. Dairy Whole or 2% milk, cream, and half-and-half. Whole or full-fat cream cheese. Whole-fat or sweetened yogurt. Full-fat cheese. Nondairy creamers. Whipped toppings.  Processed cheese and cheese spreads. Fats and oils Butter. Stick margarine. Lard. Shortening. Ghee. Bacon fat. Tropical oils, such as coconut, palm kernel, or palm oil. Seasoning and other foods Salted popcorn and pretzels. Onion salt, garlic salt, seasoned salt, table salt, and sea salt. Worcestershire sauce. Tartar sauce. Barbecue sauce. Teriyaki sauce. Soy sauce, including reduced-sodium. Steak sauce. Canned and packaged gravies. Fish sauce. Oyster sauce. Cocktail sauce. Horseradish that you find on the shelf. Ketchup. Mustard. Meat flavorings and tenderizers. Bouillon cubes. Hot sauce and Tabasco sauce. Premade or packaged marinades. Premade or packaged taco seasonings. Relishes. Regular salad dressings. Where to find more information:  National Heart, Lung, and Blood Institute: www.nhlbi.nih.gov  American Heart Association: www.heart.org Summary  The DASH eating plan is a healthy eating plan that has been shown to reduce high blood pressure (hypertension). It may also reduce your risk for type 2 diabetes, heart disease, and stroke.  With the DASH eating plan, you should limit salt (sodium) intake to 2,300 mg a day. If you have hypertension, you may need to reduce your sodium intake to 1,500 mg a day.  When on the DASH eating plan, aim to eat more fresh fruits and vegetables, whole grains, lean proteins, low-fat dairy, and heart-healthy fats.  Work with your health care provider or diet and nutrition specialist (dietitian) to adjust your eating plan to your individual   calorie needs. This information is not intended to replace advice given to you by your health care provider. Make sure you discuss any questions you have with your health care provider. Document Released: 11/04/2011 Document Revised: 11/08/2016 Document Reviewed: 11/08/2016 Elsevier Interactive Patient Education  2018 [employer].  How to Take Your Blood Pressure You can take your blood pressure at home with a machine. You  may need to check your blood pressure at home:  To check if you have high blood pressure (hypertension).  To check your blood pressure over time.  To make sure your blood pressure medicine is working.  Supplies needed: You will need a blood pressure machine, or monitor. You can buy one at a drugstore or online. When choosing one:  Choose one with an arm cuff.  Choose one that wraps around your upper arm. Only one finger should fit between your arm and the cuff.  Do not choose one that measures your blood pressure from your wrist or finger.  Your doctor can suggest a monitor. How to prepare Avoid these things for 30 minutes before checking your blood pressure:  Drinking caffeine.  Drinking alcohol.  Eating.  Smoking.  Exercising.  Five minutes before checking your blood pressure:  Pee.  Sit in a dining chair. Avoid sitting in a soft couch or armchair.  Be quiet. Do not talk.  How to take your blood pressure Follow the instructions that came with your machine. If you have a digital blood pressure monitor, these may be the instructions: 1. Sit up straight. 2. Place your feet on the floor. Do not cross your ankles or legs. 3. Rest your left arm at the level of your heart. You may rest it on a table, desk, or chair. 4. Pull up your shirt sleeve. 5. Wrap the blood pressure cuff around the upper part of your left arm. The cuff should be 1 inch (2.5 cm) above your elbow. It is best to wrap the cuff around bare skin. 6. Fit the cuff snugly around your arm. You should be able to place only one finger between the cuff and your arm. 7. Put the cord inside the groove of your elbow. 8. Press the power button. 9. Sit quietly while the cuff fills with air and loses air. 10. Write down the numbers on the screen. 11. Wait 2-3 minutes and then repeat steps 1-10.  What do the numbers mean? Two numbers make up your blood pressure. The first number is called systolic pressure. The  second is called diastolic pressure. An example of a blood pressure reading is "120 over 80" (or 120/80). If you are an adult and do not have a medical condition, use this guide to find out if your blood pressure is normal: Normal  First number: below 120.  Second number: below 80. Elevated  First number: 120-129.  Second number: below 80. Hypertension stage 1  First number: 130-139.  Second number: 80-89. Hypertension stage 2  First number: 140 or above.  Second number: 66 or above. Your blood pressure is above normal even if only the top or bottom number is above normal. Follow these instructions at home:  Check your blood pressure as often as your doctor tells you to.  Take your monitor to your next doctor's appointment. Your doctor will: ? Make sure you are using it correctly. ? Make sure it is working right.  Make sure you understand what your blood pressure numbers should be.  Tell your doctor if your  medicines are causing side effects. Contact a doctor if:  Your blood pressure keeps being high. Get help right away if:  Your first blood pressure number is higher than 180.  Your second blood pressure number is higher than 120. This information is not intended to replace advice given to you by your health care provider. Make sure you discuss any questions you have with your health care provider. Document Released: 10/28/2008 Document Revised: 10/13/2016 Document Reviewed: 04/23/2016 Elsevier Interactive Patient Education  [employer].

## 2018-04-05 NOTE — Progress Notes (Signed)
Assessment & Plan:  Theresa Arias was seen today for medication problem and hernia.  Diagnoses and all orders for this visit:  Essential hypertension Continue all antihypertensives as prescribed.  Remember to bring in your blood pressure log with you for your follow up appointment.  DASH/Mediterranean Diets are healthier choices for HTN.    Abdominal wall mass She will need to speak with the financial counselor to determine if she meets criteria for financial eligibility. She may need additional imaging to determine if true hernia vs lipoma.   Muscle cramps  Resolving. Patient instructed to contact office if worsens. May need B12/magnesium levels checked.    Patient has been counseled on age-appropriate routine health concerns for screening and prevention. These are reviewed and up-to-date. Referrals have been placed accordingly. Immunizations are up-to-date or declined.    Subjective:   Chief Complaint  Patient presents with  . Medication Problem  . Hernia   HPI Theresa Arias 32 y.o. female presents to office today with complaints of recurrent hernia and for follow up to HTN.   Essential Hypertension Chronic. She stopped taking HCTZ a few weeks after experiencing muscle cramps mainly in legs. Symptoms have since lessened. She is currently taking amlodipine 5 mg daily as prescribed. Denies chest pain, shortness of breath, palpitations, lightheadedness, dizziness, headaches or BLE edema.  BP Readings from Last 3 Encounters:  04/05/18 114/74  01/26/18 99/67  10/11/16 (!) 150/100   Abdominal Pain Patient complains of mild periumbilical abdominal pain. The pain is described as tender, and is mild/10 in intensity. Pain is located above the umbilicus without radiation. Onset was several weeks ago. Symptoms have been unchanged since. Aggravating factors: pressure.   Associated symptoms: none. The patient denies belching, constipation, diarrhea, fever, hematochezia, melena, nausea and  vomiting.   Review of Systems  Constitutional: Negative for fever, malaise/fatigue and weight loss.  HENT: Negative.  Negative for nosebleeds.   Eyes: Negative.  Negative for blurred vision, double vision and photophobia.  Respiratory: Negative.  Negative for cough and shortness of breath.   Cardiovascular: Negative.  Negative for chest pain, palpitations and leg swelling.  Gastrointestinal: Positive for abdominal pain. Negative for blood in stool, constipation, diarrhea, heartburn, melena, nausea and vomiting.  Musculoskeletal: Positive for myalgias.  Neurological: Negative.  Negative for dizziness, focal weakness, seizures and headaches.  Psychiatric/Behavioral: Negative.  Negative for suicidal ideas.    Past Medical History:  Diagnosis Date  . Hypertension     Past Surgical History:  Procedure Laterality Date  . HERNIA REPAIR      Family History  Problem Relation Age of Onset  . Hypertension Mother   . Hypertension Father     Social History Reviewed with no changes to be made today.   Outpatient Medications Prior to Visit  Medication Sig Dispense Refill  . amLODipine (NORVASC) 5 MG tablet Take 1 tablet (5 mg total) by mouth daily. 90 tablet 3  . aspirin EC 81 MG tablet Take 1 tablet (81 mg total) by mouth daily. 90 tablet 3  . hydrochlorothiazide (HYDRODIURIL) 25 MG tablet Take 1 tablet (25 mg total) by mouth daily. Take on tablet in the morning. 90 tablet 1   No facility-administered medications prior to visit.     No Known Allergies     Objective:    BP 114/74 (BP Location: Right Arm, Patient Position: Sitting, Cuff Size: Normal)   Pulse 77   Temp (!) 97.4 F (36.3 C) (Oral)   Resp 18   Ht 6'  1" (1.854 m)   Wt 167 lb (75.8 kg)   LMP 04/04/2018 Comment: last day  SpO2 100%   BMI 22.03 kg/m  Wt Readings from Last 3 Encounters:  04/05/18 167 lb (75.8 kg)  01/26/18 172 lb (78 kg)  10/11/16 175 lb (79.4 kg)    Physical Exam  Constitutional: She is  oriented to person, place, and time. She appears well-developed and well-nourished. She is cooperative.  HENT:  Head: Normocephalic and atraumatic.  Eyes: EOM are normal.  Neck: Normal range of motion.  Cardiovascular: Normal rate, regular rhythm, normal heart sounds and intact distal pulses. Exam reveals no gallop and no friction rub.  No murmur heard. Pulmonary/Chest: Effort normal and breath sounds normal. No tachypnea. No respiratory distress. She has no decreased breath sounds. She has no wheezes. She has no rhonchi. She has no rales. She exhibits no tenderness.  Abdominal: Soft. Bowel sounds are normal. She exhibits mass. She exhibits no distension. There is tenderness in the periumbilical area. There is no rebound, no guarding, no tenderness at McBurney's point and negative Murphy's sign. No hernia. Hernia confirmed negative in the ventral area.  Musculoskeletal: Normal range of motion. She exhibits no edema.  Neurological: She is alert and oriented to person, place, and time. Coordination normal.  Skin: Skin is warm and dry.  Psychiatric: She has a normal mood and affect. Her speech is normal and behavior is normal. Judgment and thought content normal. Cognition and memory are normal.  Nursing note and vitals reviewed.       Patient has been counseled extensively about nutrition and exercise as well as the importance of adherence with medications and regular follow-up. The patient was given clear instructions to go to ER or return to medical center if symptoms don't improve, worsen or new problems develop. The patient verbalized understanding.   Follow-up: Return if symptoms worsen or fail to improve.   Gildardo Pounds, FNP-BC [facility], [place]   04/05/2018, 2:59 PM

## 2018-10-17 ENCOUNTER — Other Ambulatory Visit: Payer: Self-pay

## 2018-10-17 ENCOUNTER — Encounter (INDEPENDENT_AMBULATORY_CARE_PROVIDER_SITE_OTHER): Payer: Self-pay | Admitting: Physician Assistant

## 2018-10-17 ENCOUNTER — Ambulatory Visit (INDEPENDENT_AMBULATORY_CARE_PROVIDER_SITE_OTHER): Payer: Self-pay | Admitting: Physician Assistant

## 2018-10-17 VITALS — BP 157/66 | HR 67 | Temp 97.6°F | Ht 73.0 in | Wt 168.8 lb

## 2018-10-17 DIAGNOSIS — I10 Essential (primary) hypertension: Secondary | ICD-10-CM

## 2018-10-17 DIAGNOSIS — K0889 Other specified disorders of teeth and supporting structures: Secondary | ICD-10-CM

## 2018-10-17 MED ORDER — LOSARTAN POTASSIUM 50 MG PO TABS: 50.0000 mg | ORAL_TABLET | Freq: Every day | ORAL | 5 refills | Status: DC

## 2018-10-17 MED ORDER — ACETAMINOPHEN 500 MG PO TABS: 1000.0000 mg | ORAL_TABLET | Freq: Three times a day (TID) | ORAL | 0 refills | Status: AC | PRN

## 2018-10-17 MED ORDER — AMLODIPINE BESYLATE 5 MG PO TABS: 5.0000 mg | ORAL_TABLET | Freq: Every day | ORAL | 5 refills | Status: DC

## 2018-10-17 MED ORDER — AMOXICILLIN-POT CLAVULANATE 875-125 MG PO TABS: 1.0000 | ORAL_TABLET | Freq: Two times a day (BID) | ORAL | 0 refills | Status: AC

## 2018-10-17 MED ORDER — ASPIRIN EC 81 MG PO TBEC: 81.0000 mg | DELAYED_RELEASE_TABLET | Freq: Every day | ORAL | 3 refills | Status: DC

## 2018-10-17 MED ORDER — NAPROXEN 500 MG PO TABS: 500.0000 mg | ORAL_TABLET | Freq: Two times a day (BID) | ORAL | 0 refills | Status: DC

## 2018-10-17 NOTE — Patient Instructions (Signed)
Dental Pain Dental pain may be caused by many things, including:  Tooth decay (cavities or caries). Cavities expose the nerve of your tooth to air and hot or cold temperatures. This can cause pain or discomfort.  Abscess or infection. A dental abscess is a collection of infected pus from a bacterial infection in the inner part of the tooth (pulp). It usually occurs at the end of the tooth's root.  Injury.  An unknown reason (idiopathic).  Your pain may be mild or severe. It may only occur when:  You are chewing.  You are exposed to hot or cold temperature.  You are eating or drinking sugary foods or beverages, such as soda or candy.  Your pain may also be constant. Follow these instructions at home: Watch your dental pain for any changes. The following actions may help to lessen any discomfort that you are feeling:  Take medicines only as directed by your dentist.  If you were prescribed an antibiotic medicine, finish all of it even if you start to feel better.  Keep all follow-up visits as directed by your dentist. This is important.  Do not apply heat to the outside of your face.  Rinse your mouth or gargle with salt water if directed by your dentist. This helps with pain and swelling. ? You can make salt water by adding  tsp of salt to 1 cup of warm water.  Apply ice to the painful area of your face: ? Put ice in a plastic bag. ? Place a towel between your skin and the bag. ? Leave the ice on for 20 minutes, 2-3 times per day.  Avoid foods or drinks that cause you pain, such as: ? Very hot or very cold foods or drinks. ? Sweet or sugary foods or drinks.  Contact a health care provider if:  Your pain is not controlled with medicines.  Your symptoms are worse.  You have new symptoms. Get help right away if:  You are unable to open your mouth.  You are having trouble breathing or swallowing.  You have a fever.  Your face, neck, or jaw is swollen. This  information is not intended to replace advice given to you by your health care provider. Make sure you discuss any questions you have with your health care provider. Document Released: 11/15/2005 Document Revised: 03/25/2016 Document Reviewed: 11/11/2014 Elsevier Interactive Patient Education  2018 [employer]  

## 2018-10-17 NOTE — Progress Notes (Signed)
Subjective:  Patient ID: Theresa Arias, female    DOB: 10-Oct-1986  Age: 32 y.o. MRN: [id number]  CC: dysphagia  HPI Theresa Arias is a 32 y.o. female with a medical history of HTN and BTL presents with complaint of trouble swallowing due to dental pain since approximately three days ago. Left upper wisdom tooth has erupted and is causing pain. Has not taken anything for relief. Denies any oropharyngeal swelling, facial swelling, throat swelling, headache, fever, chills, nausea, vomiting, SOB, or chest pain.      At the last visit, pt had been taking clonidine 0.2 mg BID for HTN. Was advised to stop clonidine and to begin HCTZ 25 mg and Amlodipine 5 mg. Pt called two months later saying HCTZ was causing cramps in her legs and arms. Has not been taking her amlodipine. Says she will pick up amlodipine refill today. Does not endorse any other symptoms or complaints.    Outpatient Medications Prior to Visit  Medication Sig Dispense Refill  . amLODipine (NORVASC) 5 MG tablet Take 1 tablet (5 mg total) by mouth daily. (Patient not taking: Reported on 10/17/2018) 90 tablet 3  . aspirin EC 81 MG tablet Take 1 tablet (81 mg total) by mouth daily. (Patient not taking: Reported on 10/17/2018) 90 tablet 3   No facility-administered medications prior to visit.      ROS Review of Systems  Constitutional: Negative for chills, fever and malaise/fatigue.  HENT:       Wisdom tooth pain  Eyes: Negative for blurred vision.  Respiratory: Negative for shortness of breath.   Cardiovascular: Negative for chest pain and palpitations.  Gastrointestinal: Negative for abdominal pain and nausea.  Genitourinary: Negative for dysuria and hematuria.  Musculoskeletal: Negative for joint pain and myalgias.  Skin: Negative for rash.  Neurological: Negative for tingling and headaches.  Psychiatric/Behavioral: Negative for depression. The patient is not nervous/anxious.     Objective:  BP (!) 157/66 (BP  Location: Right Arm, Patient Position: Sitting, Cuff Size: Normal)   Pulse 67   Temp 97.6 F (36.4 C) (Oral)   Ht 6\' 1"  (1.854 m)   Wt 168 lb 12.8 oz (76.6 kg)   LMP 10/04/2018 (Approximate)   SpO2 96%   BMI 22.27 kg/m   BP/Weight 10/17/2018 04/05/2018 1/74/9449  Systolic BP 675 916 99  Diastolic BP 66 74 67  Wt. (Lbs) 168.8 167 172  BMI 22.27 22.03 24.68      Physical Exam  Constitutional: She is oriented to person, place, and time.  Well developed, well nourished, NAD, polite  HENT:  Head: Normocephalic and atraumatic.  Left upper wisdom tooth erupted with no sign of abscess or bleeding. No oropharyngeal erythema or edema. Tonsils 2+ bilaterally. Uvula midline. No facial swelling, no neck swelling.   Eyes: No scleral icterus.  Neck: Normal range of motion. Neck supple. No thyromegaly present.  Cardiovascular: Normal rate, regular rhythm and normal heart sounds.  Pulmonary/Chest: Effort normal and breath sounds normal.  Musculoskeletal: She exhibits no edema.  Neurological: She is alert and oriented to person, place, and time.  Skin: Skin is warm and dry. No rash noted. No erythema. No pallor.  Psychiatric: She has a normal mood and affect. Her behavior is normal. Thought content normal.  Vitals reviewed.    Assessment & Plan:    1. Essential hypertension - losartan (COZAAR) 50 MG tablet; Take 1 tablet (50 mg total) by mouth daily.  Dispense: 30 tablet; Refill: 5 - amLODipine (NORVASC) 5 MG tablet;  Take 1 tablet (5 mg total) by mouth daily.  Dispense: 30 tablet; Refill: 5 - aspirin EC 81 MG tablet; Take 1 tablet (81 mg total) by mouth daily.  Dispense: 90 tablet; Refill: 3 - Lipid panel; Future - Comprehensive metabolic panel; Future  2. Pain, dental - amoxicillin-clavulanate (AUGMENTIN) 875-125 MG tablet; Take 1 tablet by mouth 2 (two) times daily for 10 days.  Dispense: 20 tablet; Refill: 0 - naproxen (NAPROSYN) 500 MG tablet; Take 1 tablet (500 mg total) by mouth 2  (two) times daily with a meal.  Dispense: 20 tablet; Refill: 0 - acetaminophen (TYLENOL) 500 MG tablet; Take 2 tablets (1,000 mg total) by mouth every 8 (eight) hours as needed for up to 7 days.  Dispense: 42 tablet; Refill: 0 - Advised to obtain the orange card for referral to [facility].  Meds ordered this encounter  Medications  . amoxicillin-clavulanate (AUGMENTIN) 875-125 MG tablet    Sig: Take 1 tablet by mouth 2 (two) times daily for 10 days.    Dispense:  20 tablet    Refill:  0    Order Specific Question:   Supervising Provider    Answer:   Charlott Rakes [[id number]]  . naproxen (NAPROSYN) 500 MG tablet    Sig: Take 1 tablet (500 mg total) by mouth 2 (two) times daily with a meal.    Dispense:  20 tablet    Refill:  0    Order Specific Question:   Supervising Provider    Answer:   Charlott Rakes [[id number]]  . acetaminophen (TYLENOL) 500 MG tablet    Sig: Take 2 tablets (1,000 mg total) by mouth every 8 (eight) hours as needed for up to 7 days.    Dispense:  42 tablet    Refill:  0    Order Specific Question:   Supervising Provider    Answer:   Charlott Rakes [[id number]]  . losartan (COZAAR) 50 MG tablet    Sig: Take 1 tablet (50 mg total) by mouth daily.    Dispense:  30 tablet    Refill:  5    Order Specific Question:   Supervising Provider    Answer:   Charlott Rakes [[id number]]  . amLODipine (NORVASC) 5 MG tablet    Sig: Take 1 tablet (5 mg total) by mouth daily.    Dispense:  30 tablet    Refill:  5    Order Specific Question:   Supervising Provider    Answer:   Charlott Rakes [[id number]]  . aspirin EC 81 MG tablet    Sig: Take 1 tablet (81 mg total) by mouth daily.    Dispense:  90 tablet    Refill:  3    Order Specific Question:   Supervising Provider    Answer:   Charlott Rakes [[id number]]    Follow-up: Return in about 4 weeks (around 11/14/2018) for HTN.   Clent Demark PA

## 2018-10-18 MED FILL — AMLODIPINE BESYLATE 5 MG TA: 5 | 30 days supply | Qty: 30 | Fill #0

## 2018-10-18 MED FILL — NAPROXEN 500 MG TABLET: 500 | 10 days supply | Qty: 20 | Fill #0

## 2018-10-18 MED FILL — AMOX-CLAV 875-125 MG TABLET: 875-125 | 10 days supply | Qty: 20 | Fill #0

## 2018-10-18 MED FILL — LOSARTAN POTASSIUM 50 MG TA: 50 | 30 days supply | Qty: 30 | Fill #0

## 2018-11-14 ENCOUNTER — Ambulatory Visit (INDEPENDENT_AMBULATORY_CARE_PROVIDER_SITE_OTHER): Payer: Self-pay | Admitting: Physician Assistant

## 2019-02-13 ENCOUNTER — Encounter (INDEPENDENT_AMBULATORY_CARE_PROVIDER_SITE_OTHER): Payer: Self-pay | Admitting: Primary Care

## 2019-02-13 ENCOUNTER — Other Ambulatory Visit: Payer: Self-pay

## 2019-02-13 ENCOUNTER — Ambulatory Visit (INDEPENDENT_AMBULATORY_CARE_PROVIDER_SITE_OTHER): Payer: Self-pay | Admitting: Primary Care

## 2019-02-13 VITALS — BP 146/91 | HR 68 | Temp 97.9°F | Resp 16 | Ht 68.0 in | Wt 164.0 lb

## 2019-02-13 DIAGNOSIS — H6123 Impacted cerumen, bilateral: Secondary | ICD-10-CM

## 2019-02-13 DIAGNOSIS — R0981 Nasal congestion: Secondary | ICD-10-CM

## 2019-02-13 DIAGNOSIS — T7840XA Allergy, unspecified, initial encounter: Secondary | ICD-10-CM

## 2019-02-13 MED ORDER — FEXOFENADINE-PSEUDOEPHED ER 60-120 MG PO TB12: 1.0000 | ORAL_TABLET | Freq: Two times a day (BID) | ORAL | 1 refills | Status: DC

## 2019-02-13 MED ORDER — SALINE SPRAY 0.65 % NA SOLN: 1.0000 | NASAL | 3 refills | Status: DC | PRN

## 2019-02-13 MED ORDER — FLUTICASONE PROPIONATE 50 MCG/ACT NA SUSP: 2.0000 | Freq: Every day | NASAL | 6 refills | Status: DC

## 2019-02-13 NOTE — Progress Notes (Signed)
Acute Office Visit  Subjective:    Patient ID: Theresa Arias, female    DOB: 1986-07-15, 33 y.o.   MRN: [id number]  No chief complaint on file.   HPI Patient is in today for cold like and symptoms.  Nasal conjestion no fever or chills. Patients states she was wearing what the weather felt like and the follows it turned cold. Now she is suffering with nasal congestion and sore.  Past Medical History:  Diagnosis Date  . Hypertension     Past Surgical History:  Procedure Laterality Date  . HERNIA REPAIR      Family History  Problem Relation Age of Onset  . Hypertension Mother   . Hypertension Father     Social History   Socioeconomic History  . Marital status: Single    Spouse name: Not on file  . Number of children: Not on file  . Years of education: Not on file  . Highest education level: Not on file  Occupational History  . Not on file  Social Needs  . Financial resource strain: Not on file  . Food insecurity:    Worry: Not on file    Inability: Not on file  . Transportation needs:    Medical: Not on file    Non-medical: Not on file  Tobacco Use  . Smoking status: Current Every Day Smoker    Packs/day: 0.50    Types: Cigarettes  . Smokeless tobacco: Never Used  Substance and Sexual Activity  . Alcohol use: Yes    Comment: occ  . Drug use: No  . Sexual activity: Yes    Birth control/protection: None  Lifestyle  . Physical activity:    Days per week: Not on file    Minutes per session: Not on file  . Stress: Not on file  Relationships  . Social connections:    Talks on phone: Not on file    Gets together: Not on file    Attends religious service: Not on file    Active member of club or organization: Not on file    Attends meetings of clubs or organizations: Not on file    Relationship status: Not on file  . Intimate partner violence:    Fear of current or ex partner: Not on file    Emotionally abused: Not on file    Physically abused: Not  on file    Forced sexual activity: Not on file  Other Topics Concern  . Not on file  Social History Narrative  . Not on file    Outpatient Medications Prior to Visit  Medication Sig Dispense Refill  . amLODipine (NORVASC) 5 MG tablet Take 1 tablet (5 mg total) by mouth daily. 30 tablet 5  . aspirin EC 81 MG tablet Take 1 tablet (81 mg total) by mouth daily. 90 tablet 3  . losartan (COZAAR) 50 MG tablet Take 1 tablet (50 mg total) by mouth daily. 30 tablet 5  . naproxen (NAPROSYN) 500 MG tablet Take 1 tablet (500 mg total) by mouth 2 (two) times daily with a meal. 20 tablet 0   No facility-administered medications prior to visit.     No Known Allergies  Review of Systems  Constitutional: Positive for malaise/fatigue.  HENT: Positive for congestion, ear pain and sore throat.   Eyes: Negative.   Respiratory: Negative.   Cardiovascular: Negative.   Gastrointestinal: Negative.   Genitourinary: Negative.   Musculoskeletal: Negative.   Skin: Negative.   Neurological: Negative.  Endo/Heme/Allergies: Negative.   Psychiatric/Behavioral: Negative.        Objective:    Physical Exam  Constitutional: She is oriented to person, place, and time. She appears well-developed and well-nourished.  HENT:  Head: Normocephalic and atraumatic.  Eyes: Pupils are equal, round, and reactive to light. EOM are normal.  Neck: Normal range of motion. Neck supple.  Cardiovascular: Normal rate and regular rhythm.  Pulmonary/Chest: Effort normal.  Abdominal: Soft. Bowel sounds are normal.  Musculoskeletal: Normal range of motion.  Neurological: She is alert and oriented to person, place, and time.  Skin: Skin is warm and dry.  Psychiatric: She has a normal mood and affect.    BP (!) 146/91 (BP Location: Right Arm, Patient Position: Sitting, Cuff Size: Large)   Pulse 68   Temp 97.9 F (36.6 C) (Oral)   Resp 16   Ht 5\' 8"  (1.727 m)   Wt 164 lb (74.4 kg)   LMP 02/12/2019   SpO2 98%   BMI  24.94 kg/m  Wt Readings from Last 3 Encounters:  02/13/19 164 lb (74.4 kg)  10/17/18 168 lb 12.8 oz (76.6 kg)  04/05/18 167 lb (75.8 kg)    Health Maintenance Due  Topic Date Due  . PAP SMEAR-Modifier  09/12/2005    There are no preventive care reminders to display for this patient.   Lab Results  Component Value Date   TSH 1.770 01/26/2018   Lab Results  Component Value Date   WBC 7.7 01/26/2018   HGB 12.0 01/26/2018   HCT 37.4 01/26/2018   MCV 88 01/26/2018   PLT 246 01/26/2018   Lab Results  Component Value Date   NA 140 01/26/2018   K 4.3 01/26/2018   CO2 22 01/26/2018   GLUCOSE 95 01/26/2018   BUN 15 01/26/2018   CREATININE 0.99 01/26/2018   BILITOT <0.2 01/26/2018   ALKPHOS 53 01/26/2018   AST 16 01/26/2018   ALT 18 01/26/2018   PROT 6.7 01/26/2018   ALBUMIN 4.0 01/26/2018   CALCIUM 9.1 01/26/2018   ANIONGAP 7 06/24/2015   No results found for: CHOL No results found for: HDL No results found for: LDLCALC No results found for: TRIG No results found for: CHOLHDL No results found for: HGBA1C     Assessment & Plan:   Problem List Items Addressed This Visit    None    Visit Diagnoses    Nasal congestion    -  Primary   Cerumen debris on tympanic membrane of both ears       Allergic state, initial encounter           Meds ordered this encounter  Medications  . fluticasone (FLONASE) 50 MCG/ACT nasal spray    Sig: Place 2 sprays into both nostrils daily.    Dispense:  16 g    Refill:  6  . sodium chloride (OCEAN) 0.65 % SOLN nasal spray    Sig: Place 1 spray into both nostrils as needed for up to 30 days for congestion.    Dispense:  1 Bottle    Refill:  3  . fexofenadine-pseudoephedrine (ALLEGRA-D ALLERGY & CONGESTION) 60-120 MG 12 hr tablet    Sig: Take 1 tablet by mouth 2 (two) times daily.    Dispense:  30 tablet    Refill:  1     Kerin Perna, NP

## 2019-02-13 NOTE — Progress Notes (Signed)
Sore throat and Nasal congestion  Right ear pain  Denies cough  Sx's onset Sunday

## 2019-02-13 NOTE — Patient Instructions (Signed)
Nonallergic Rhinitis Nonallergic rhinitis is a condition that causes symptoms that affect the nose, such as a runny nose and a stuffed-up nose (nasal congestion) that can make it hard to breathe through the nose. This condition is different from having an allergy (allergic rhinitis). Allergic rhinitis occurs when the body's defense system (immune system) reacts to a substance that you are allergic to (allergen), such as pollen, pet dander, mold, or dust. Nonallergic rhinitis has many similar symptoms, but it is not caused by allergens. Nonallergic rhinitis can be a short-term or long-term problem. What are the causes? This condition can be caused by many different things. Some common types of nonallergic rhinitis include: Infectious rhinitis  This is usually due to an infection in the upper respiratory tract. Vasomotor rhinitis  This is the most common type of long-term nonallergic rhinitis.  It is caused by too much blood flow through the nose, which makes the tissue inside of the nose swell.  Symptoms are often triggered by strong odors, cold air, stress, drinking alcohol, cigarette smoke, or changes in the weather. Occupational rhinitis  This type is caused by triggers in the workplace, such as chemicals, dusts, animal dander, or air pollution. Hormonal rhinitis  This type occurs in women as a result of an increase in the female hormone estrogen.  It may occur during pregnancy, puberty, and menstrual cycles.  Symptoms improve when estrogen levels drop. Drug-induced rhinitis Several drugs can cause nonallergic rhinitis, including:  Medicines that are used to treat high blood pressure, heart disease, and Parkinson disease.  Aspirin and NSAIDs.  Over-the-counter nasal decongestant sprays. These can cause a type of nonallergic rhinitis (rhinitis medicamentosa) when they are used for more than a few days. Nonallergic rhinitis with eosinophilia syndrome (NARES)  This type is caused by  having too much of a certain type of white blood cell (eosinophil). Nonallergic rhinitis can also be caused by a reaction to eating hot or spicy foods. This does not usually cause long-term symptoms. In some cases, the cause of nonallergic rhinitis is not known. What increases the risk? You are more likely to develop this condition if:  You are 30-60 years of age.  You are a woman. Women are twice as likely to have this condition. What are the signs or symptoms? Common symptoms of this condition include:  Nasal congestion.  Runny nose.  The feeling of mucus going down the back of the throat (postnasal drip).  Trouble sleeping at night and daytime sleepiness. Less common symptoms include:  Sneezing.  Coughing.  Itchy nose.  Bloodshot eyes. How is this diagnosed? This condition may be diagnosed based on:  Your symptoms and medical history.  A physical exam.  Allergy testing to rule out allergic rhinitis. You may have skin tests or blood tests. In some cases, the health care provider may take a swab of nasal secretions to look for an increased number of eosinophils. This would be done to confirm a diagnosis of NARES. How is this treated? Treatment for this condition depends on the cause. No single treatment works for everyone. Work with your health care provider to find the best treatment for you. Treatment may include:  Avoiding the things that trigger your symptoms.  Using medicines to relieve congestion, such as: ? Steroid nasal spray. There are many types. You may need to try a few to find out which one works best. ? Decongestant medicine. This may be an oral medicine or a nasal spray. These medicines are only used for   a short time.  Using medicines to relieve a runny nose. These may include antihistamine medicines or anticholinergic nasal sprays.  Surgery to remove tissue from inside the nose may be needed in severe cases if the condition has not improved after 6-12  months of medical treatment. Follow these instructions at home:  Take or use over-the-counter and prescription medicines only as told by your health care provider. Do not stop using your medicine even if you start to feel better.  Use salt-water (saline) rinses or other solutions (nasal washes or irrigations) to wash or rinse out the inside of your nose as told by your health care provider.  Do not take NSAIDs or medicines that contain aspirin if they make your symptoms worse.  Do not drink alcohol if it makes your symptoms worse.  Do not use any tobacco products, such as cigarettes, chewing tobacco, and e-cigarettes. If you need help quitting, ask your health care provider.  Avoid secondhand smoke.  Get some exercise every day. Exercise may help reduce symptoms of nonallergic rhinitis for some people. Ask your health care provider how much exercise and what types of exercise are safe for you.  Sleep with the head of your bed raised (elevated). This may reduce nighttime nasal congestion.  Keep all follow-up visits as told by your health care provider. This is important. Contact a health care provider if:  You have a fever.  Your symptoms are getting worse at home.  Your symptoms are not responding to medicine.  You develop new symptoms, especially a headache or nosebleed. This information is not intended to replace advice given to you by your health care provider. Make sure you discuss any questions you have with your health care provider. Document Released: 03/08/2016 Document Revised: 04/22/2016 Document Reviewed: 02/05/2016 Elsevier Interactive Patient Education  2019 [employer]  

## 2019-02-27 ENCOUNTER — Telehealth: Payer: Self-pay | Admitting: Primary Care

## 2019-03-23 ENCOUNTER — Telehealth: Payer: Self-pay

## 2019-03-23 ENCOUNTER — Other Ambulatory Visit: Payer: Self-pay | Admitting: Primary Care

## 2019-03-23 DIAGNOSIS — Z202 Contact with and (suspected) exposure to infections with a predominantly sexual mode of transmission: Secondary | ICD-10-CM

## 2019-03-23 NOTE — Telephone Encounter (Signed)
Tried to contact pt to make aware that her appointment has been changed to a lab appointment only pt phone is not in service. Not able to lvm

## 2019-03-26 ENCOUNTER — Other Ambulatory Visit: Payer: Self-pay

## 2019-08-24 ENCOUNTER — Ambulatory Visit (INDEPENDENT_AMBULATORY_CARE_PROVIDER_SITE_OTHER): Payer: Self-pay | Admitting: Primary Care

## 2019-12-13 ENCOUNTER — Telehealth (INDEPENDENT_AMBULATORY_CARE_PROVIDER_SITE_OTHER): Payer: Self-pay | Admitting: Primary Care

## 2019-12-13 DIAGNOSIS — L98492 Non-pressure chronic ulcer of skin of other sites with fat layer exposed: Secondary | ICD-10-CM

## 2019-12-13 MED ORDER — IBUPROFEN 600 MG PO TABS: 600.0000 mg | ORAL_TABLET | Freq: Three times a day (TID) | ORAL | 1 refills | Status: DC | PRN

## 2019-12-13 MED FILL — IBUPROFEN 600 MG TABLET: 600 | 30 days supply | Qty: 90 | Fill #0

## 2019-12-13 NOTE — Progress Notes (Signed)
Virtual Visit via Telephone Note  I connected with Theresa Arias on 12/13/19 at 11:10 AM EST by telephone and verified that I am speaking with the correct person using two identifiers.   I discussed the limitations, risks, security and privacy concerns of performing an evaluation and management service by telephone and the availability of in person appointments. I also discussed with the patient that there may be a patient responsible charge related to this service. The patient expressed understanding and agreed to proceed.   History of Present Illness: Theresa Arias web visit for pain on the center of her left foot appears to be a callous increased pain when working and wearing steel toe shoes..  Past Medical History:  Diagnosis Date  . Hypertension    Current Outpatient Medications on File Prior to Visit  Medication Sig Dispense Refill  . amLODipine (NORVASC) 5 MG tablet Take 1 tablet (5 mg total) by mouth daily. 30 tablet 5  . aspirin EC 81 MG tablet Take 1 tablet (81 mg total) by mouth daily. 90 tablet 3  . fexofenadine-pseudoephedrine (ALLEGRA-D ALLERGY & CONGESTION) 60-120 MG 12 hr tablet Take 1 tablet by mouth 2 (two) times daily. 30 tablet 1  . fluticasone (FLONASE) 50 MCG/ACT nasal spray Place 2 sprays into both nostrils daily. 16 g 6  . losartan (COZAAR) 50 MG tablet Take 1 tablet (50 mg total) by mouth daily. 30 tablet 5  . sodium chloride (OCEAN) 0.65 % SOLN nasal spray Place 1 spray into both nostrils as needed for up to 30 days for congestion. 1 Bottle 3   No current facility-administered medications on file prior to visit.     Observations/Objective: Review of Systems  Skin:       callous in center of left foot  All other systems reviewed and are negative.   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    Follow Up Instructions:    I discussed the assessment and treatment plan with the patient. The patient was provided an opportunity to ask questions and all were answered. The patient agreed with the plan and demonstrated an understanding of the instructions.   The patient was advised to call back or seek an in-person evaluation if the symptoms worsen or if the condition fails to improve as anticipated.  I provided 8 minutes of non-face-to-face time during this encounter.   Kerin Perna, NP

## 2020-03-12 ENCOUNTER — Ambulatory Visit: Payer: Medicaid Other | Admitting: Podiatry

## 2020-05-03 ENCOUNTER — Encounter (HOSPITAL_COMMUNITY): Payer: Self-pay

## 2020-05-03 ENCOUNTER — Other Ambulatory Visit: Payer: Self-pay

## 2020-05-03 ENCOUNTER — Emergency Department (HOSPITAL_COMMUNITY)
Admission: EM | Admit: 2020-05-03 | Discharge: 2020-05-03 | Disposition: A | Discharge status: Home / Self Care | Payer: Medicaid Other | Attending: Emergency Medicine | Admitting: Emergency Medicine

## 2020-05-03 DIAGNOSIS — L089 Local infection of the skin and subcutaneous tissue, unspecified: Secondary | ICD-10-CM | POA: Diagnosis not present

## 2020-05-03 DIAGNOSIS — Z79899 Other long term (current) drug therapy: Secondary | ICD-10-CM | POA: Insufficient documentation

## 2020-05-03 DIAGNOSIS — S01511A Laceration without foreign body of lip, initial encounter: Secondary | ICD-10-CM | POA: Diagnosis present

## 2020-05-03 DIAGNOSIS — Y939 Activity, unspecified: Secondary | ICD-10-CM | POA: Diagnosis not present

## 2020-05-03 DIAGNOSIS — F17210 Nicotine dependence, cigarettes, uncomplicated: Secondary | ICD-10-CM | POA: Insufficient documentation

## 2020-05-03 DIAGNOSIS — Y999 Unspecified external cause status: Secondary | ICD-10-CM | POA: Diagnosis not present

## 2020-05-03 DIAGNOSIS — Y929 Unspecified place or not applicable: Secondary | ICD-10-CM | POA: Diagnosis not present

## 2020-05-03 DIAGNOSIS — W500XXA Accidental hit or strike by another person, initial encounter: Secondary | ICD-10-CM | POA: Insufficient documentation

## 2020-05-03 DIAGNOSIS — Z7982 Long term (current) use of aspirin: Secondary | ICD-10-CM | POA: Diagnosis not present

## 2020-05-03 DIAGNOSIS — I10 Essential (primary) hypertension: Secondary | ICD-10-CM | POA: Insufficient documentation

## 2020-05-03 MED ORDER — IBUPROFEN 600 MG PO TABS: 600.0000 mg | ORAL_TABLET | Freq: Four times a day (QID) | ORAL | 0 refills | Status: DC | PRN

## 2020-05-03 MED ORDER — CEPHALEXIN 500 MG PO CAPS
500.0000 mg | ORAL_CAPSULE | Freq: Once | ORAL | Status: AC
  Administered 2020-05-03: 500 mg via ORAL
  Filled 2020-05-03: qty 1

## 2020-05-03 MED ORDER — CEPHALEXIN 500 MG PO CAPS: 500.0000 mg | ORAL_CAPSULE | Freq: Four times a day (QID) | ORAL | 0 refills | Status: DC
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
## 2020-05-03 NOTE — Discharge Instructions (Signed)
You have suffered a laceration to your lip that appears to be infected.  Please take antibiotic as prescribed.  Continue to cleanse the wound regularly.  Take ibuprofen for pain.

## 2020-05-06 ENCOUNTER — Other Ambulatory Visit (INDEPENDENT_AMBULATORY_CARE_PROVIDER_SITE_OTHER): Payer: Self-pay | Admitting: Primary Care

## 2020-05-06 ENCOUNTER — Telehealth (INDEPENDENT_AMBULATORY_CARE_PROVIDER_SITE_OTHER): Payer: Self-pay

## 2020-05-06 DIAGNOSIS — S01511A Laceration without foreign body of lip, initial encounter: Secondary | ICD-10-CM

## 2020-05-06 NOTE — Telephone Encounter (Signed)
Patient called to request a referral to plastic surgery. She was recently seen in the ED for an infected lip laceration. Nat Christen, CMA

## 2020-05-16 ENCOUNTER — Encounter (INDEPENDENT_AMBULATORY_CARE_PROVIDER_SITE_OTHER): Payer: Self-pay | Admitting: Primary Care

## 2020-05-16 ENCOUNTER — Other Ambulatory Visit: Payer: Self-pay

## 2020-05-16 ENCOUNTER — Ambulatory Visit (INDEPENDENT_AMBULATORY_CARE_PROVIDER_SITE_OTHER): Payer: Medicaid Other | Admitting: Primary Care

## 2020-05-16 VITALS — BP 159/110 | HR 59 | Temp 97.5°F | Ht 72.0 in | Wt 159.4 lb

## 2020-05-16 DIAGNOSIS — I10 Essential (primary) hypertension: Secondary | ICD-10-CM

## 2020-05-16 DIAGNOSIS — Z09 Encounter for follow-up examination after completed treatment for conditions other than malignant neoplasm: Secondary | ICD-10-CM | POA: Diagnosis not present

## 2020-05-16 MED ORDER — DILTIAZEM HCL ER 60 MG PO CP12: 60.0000 mg | ORAL_CAPSULE | Freq: Two times a day (BID) | ORAL | 1 refills | Status: DC

## 2020-05-16 NOTE — Progress Notes (Signed)
Established Patient Office Visit  Subjective:  Patient ID: Theresa Arias, female    DOB: 1986-05-05  Age: 34 y.o. MRN: [id number]  CC:  Chief Complaint  Patient presents with  . Blood Pressure Check    Pt is here for BP Check.     HPI Theresa Arias presents for blood pressure follow up and hospital follow up for laceration to the left upper lip on 05/03/2020.Denies shortness of breath, headaches, chest pain or lower extremity edema. Theresa Arias complained of dizzy spells taking losartan. Blood pressure today is unacceptable and uncontrolled   Past Medical History:  Diagnosis Date  . Hypertension     Past Surgical History:  Procedure Laterality Date  . HERNIA REPAIR      Family History  Problem Relation Age of Onset  . Hypertension Mother   . Hypertension Father     Social History   Socioeconomic History  . Marital status: Single    Spouse name: Not on file  . Number of children: Not on file  . Years of education: Not on file  . Highest education level: Not on file  Occupational History  . Not on file  Tobacco Use  . Smoking status: Current Every Day Smoker    Packs/day: 0.50    Types: Cigarettes  . Smokeless tobacco: Never Used  Substance and Sexual Activity  . Alcohol use: Yes    Comment: occ  . Drug use: No  . Sexual activity: Yes    Birth control/protection: None  Other Topics Concern  . Not on file  Social History Narrative  . Not on file   Social Determinants of Health   Financial Resource Strain:   . Difficulty of Paying Living Expenses:   Food Insecurity:   . Worried About Charity fundraiser in the Last Year:   . Arboriculturist in the Last Year:   Transportation Needs:   . Film/video editor (Medical):   Marland Kitchen Lack of Transportation (Non-Medical):   Physical Activity:   . Days of Exercise per Week:   . Minutes of Exercise per Session:   Stress:   . Feeling of Stress :   Social Connections:   . Frequency of Communication with Friends  and Family:   . Frequency of Social Gatherings with Friends and Family:   . Attends Religious Services:   . Active Member of Clubs or Organizations:   . Attends Archivist Meetings:   Marland Kitchen Marital Status:   Intimate Partner Violence:   . Fear of Current or Ex-Partner:   . Emotionally Abused:   Marland Kitchen Physically Abused:   . Sexually Abused:     Outpatient Medications Prior to Visit  Medication Sig Dispense Refill  . aspirin EC 81 MG tablet Take 1 tablet (81 mg total) by mouth daily. (Patient not taking: Reported on 05/16/2020) 90 tablet 3  . cephALEXin (KEFLEX) 500 MG capsule Take 1 capsule (500 mg total) by mouth 4 (four) times daily. (Patient not taking: Reported on 05/16/2020) 40 capsule 0  . fexofenadine-pseudoephedrine (ALLEGRA-D ALLERGY & CONGESTION) 60-120 MG 12 hr tablet Take 1 tablet by mouth 2 (two) times daily. (Patient not taking: Reported on 05/16/2020) 30 tablet 1  . fluticasone (FLONASE) 50 MCG/ACT nasal spray Place 2 sprays into both nostrils daily. (Patient not taking: Reported on 05/16/2020) 16 g 6  . ibuprofen (ADVIL) 600 MG tablet Take 1 tablet (600 mg total) by mouth every 6 (six) hours as needed for moderate pain. (Patient not  taking: Reported on 05/16/2020) 20 tablet 0  . sodium chloride (OCEAN) 0.65 % SOLN nasal spray Place 1 spray into both nostrils as needed for up to 30 days for congestion. 1 Bottle 3  . amLODipine (NORVASC) 5 MG tablet Take 1 tablet (5 mg total) by mouth daily. 30 tablet 5  . losartan (COZAAR) 50 MG tablet Take 1 tablet (50 mg total) by mouth daily. 30 tablet 5   No facility-administered medications prior to visit.    No Known Allergies Left ear cerumen  ROS Review of Systems  Neurological: Positive for dizziness and headaches.  All other systems reviewed and are negative.     Objective:    Physical Exam Constitutional:      Appearance: Normal appearance.  HENT:     Head: Normocephalic.     Right Ear: Tympanic membrane normal.      Left Ear: Tympanic membrane normal.  Cardiovascular:     Rate and Rhythm: Normal rate and regular rhythm.     Pulses: Normal pulses.  Pulmonary:     Effort: Pulmonary effort is normal.     Breath sounds: Normal breath sounds.  Abdominal:     General: Bowel sounds are normal.  Skin:    General: Skin is warm and dry.  Neurological:     Mental Status: Theresa Arias is alert and oriented to person, place, and time.  Psychiatric:        Behavior: Behavior normal.     BP (!) 159/110 (BP Location: Left Arm, Patient Position: Sitting, Cuff Size: Normal)   Pulse (!) 59   Temp (!) 97.5 F (36.4 C) (Temporal)   Ht 6' (1.829 m)   Wt 159 lb 6.4 oz (72.3 kg)   LMP 05/09/2020   BMI 21.62 kg/m  Wt Readings from Last 3 Encounters:  05/16/20 159 lb 6.4 oz (72.3 kg)  05/03/20 159 lb 1.6 oz (72.2 kg)  02/13/19 164 lb (74.4 kg)     Health Maintenance Due  Topic Date Due  . Hepatitis C Screening  Never done  . COVID-19 Vaccine (1) Never done  . PAP SMEAR-Modifier  Never done    There are no preventive care reminders to display for this patient.  Lab Results  Component Value Date   TSH 1.770 01/26/2018   Lab Results  Component Value Date   WBC 7.7 01/26/2018   HGB 12.0 01/26/2018   HCT 37.4 01/26/2018   MCV 88 01/26/2018   PLT 246 01/26/2018   Lab Results  Component Value Date   NA 140 01/26/2018   K 4.3 01/26/2018   CO2 22 01/26/2018   GLUCOSE 95 01/26/2018   BUN 15 01/26/2018   CREATININE 0.99 01/26/2018   BILITOT <0.2 01/26/2018   ALKPHOS 53 01/26/2018   AST 16 01/26/2018   ALT 18 01/26/2018   PROT 6.7 01/26/2018   ALBUMIN 4.0 01/26/2018   CALCIUM 9.1 01/26/2018   ANIONGAP 7 06/24/2015   No results found for: CHOL No results found for: HDL No results found for: LDLCALC No results found for: TRIG No results found for: CHOLHDL No results found for: HGBA1C    Assessment & Plan:  [name] was seen today for blood pressure check.  Diagnoses and all orders for this  visit:  Essential hypertension Counseled on blood pressure goal of less than 130/80, low-sodium, DASH diet, medication compliance, 150 minutes of moderate intensity exercise per week. Discontinued amlodipine 5mg  and losartan 50  Change to diltiazem (CARDIZEM SR) 60 MG 12 hr  capsule; Take 1 capsule (60 mg total) by mouth 2 (two) times daily.  Hospital discharge follow-up Theresa Arias a laceration to Theresa Arias left upper lip .  The evidence of localized infection at the affected area. Treated with Cephalexin 500mg  qid and ibuprofen 600 mg prn   Other orders -     diltiazem (CARDIZEM SR) 60 MG 12 hr capsule; Take 1 capsule (60 mg total) by mouth 2 (two) times daily.   Meds ordered this encounter  Medications  . diltiazem (CARDIZEM SR) 60 MG 12 hr capsule    Sig: Take 1 capsule (60 mg total) by mouth 2 (two) times daily.    Dispense:  180 capsule    Refill:  1    Follow-up: Return in about 4 weeks (around 06/13/2020) for Bp check.    Kerin Perna, NP

## 2020-06-26 ENCOUNTER — Institutional Professional Consult (permissible substitution): Payer: Medicaid Other | Admitting: Plastic Surgery

## 2020-11-19 ENCOUNTER — Other Ambulatory Visit (INDEPENDENT_AMBULATORY_CARE_PROVIDER_SITE_OTHER): Payer: Self-pay | Admitting: Primary Care

## 2020-11-19 ENCOUNTER — Telehealth (INDEPENDENT_AMBULATORY_CARE_PROVIDER_SITE_OTHER): Payer: Self-pay | Admitting: Primary Care

## 2020-11-19 DIAGNOSIS — L98492 Non-pressure chronic ulcer of skin of other sites with fat layer exposed: Secondary | ICD-10-CM

## 2020-11-19 NOTE — Telephone Encounter (Signed)
Patient called to ask if the doctor could put in another referral for a podiatrist.  She stated that her Medicaid insurance is good now and she is having a lot of pain and needs to see a specialist.  CB# [phone]

## 2020-11-19 NOTE — Telephone Encounter (Signed)
Sent to PCP to place podiatry referral again.

## 2020-11-24 ENCOUNTER — Encounter: Payer: Self-pay | Admitting: Podiatry

## 2020-11-24 ENCOUNTER — Other Ambulatory Visit: Payer: Self-pay

## 2020-11-24 ENCOUNTER — Ambulatory Visit: Payer: Medicaid Other | Admitting: Podiatry

## 2020-11-24 DIAGNOSIS — D492 Neoplasm of unspecified behavior of bone, soft tissue, and skin: Secondary | ICD-10-CM | POA: Diagnosis not present

## 2020-11-25 ENCOUNTER — Telehealth: Payer: Self-pay | Admitting: Podiatry

## 2020-11-25 NOTE — Telephone Encounter (Signed)
Pt is requesting a doctors note to be out of work for a few days. She had a procedure done in office yesterday and its hard to stand at work for 8 hours. Please advise

## 2020-11-26 ENCOUNTER — Encounter: Payer: Self-pay | Admitting: Podiatry

## 2020-11-26 NOTE — Telephone Encounter (Signed)
She can have note for rest of the week

## 2020-11-26 NOTE — Progress Notes (Signed)
Subjective:   Patient ID: Theresa Arias, female   DOB: 34 y.o.   MRN: [id number]   HPI Patient presents with thick lesion underneath the first and fifth metatarsals left foot that have been sore and making it hard for her to walk.  Patient states this is occurred relatively recently and that she does will have to walk at work.  Patient smokes 1/2 pack/day and tries to be active   Review of Systems  All other systems reviewed and are negative.       Objective:  Physical Exam Vitals and nursing note reviewed.  Constitutional:      Appearance: She is well-developed and well-nourished.  Cardiovascular:     Pulses: Intact distal pulses.  Pulmonary:     Effort: Pulmonary effort is normal.  Musculoskeletal:        General: Normal range of motion.  Skin:    General: Skin is warm.  Neurological:     Mental Status: She is alert.     Neurovascular status found to be intact muscle strength found to be adequate range of motion adequate.  Patient is found to have significant keratotic lesions plantar aspect left first and fifth metatarsal that upon debridement show pinpoint bleeding and they are painful to lateral pressure with patient having good digital perfusion well oriented     Assessment:  Chronic lesion formation which may be due to benign neoplasm formation or possibly related to bone structure     Plan:  H&P x-ray conditions reviewed with patient sterile debridement accomplished and applied agent to create immune response with sterile dressings and explained what to do if any blistering were to occur.  Patient will be seen back and will be treated depending on response to this conservative treatment plan

## 2020-11-26 NOTE — Telephone Encounter (Signed)
done

## 2021-02-10 ENCOUNTER — Ambulatory Visit (INDEPENDENT_AMBULATORY_CARE_PROVIDER_SITE_OTHER): Payer: Medicaid Other | Admitting: Primary Care

## 2021-03-30 ENCOUNTER — Other Ambulatory Visit (INDEPENDENT_AMBULATORY_CARE_PROVIDER_SITE_OTHER): Payer: Self-pay | Admitting: Primary Care

## 2021-03-30 NOTE — Telephone Encounter (Signed)
  Notes to clinic:  Patient no showed appt on 02/10/2021 Review for refill    Requested Prescriptions  Pending Prescriptions Disp Refills   diltiazem (CARDIZEM SR) 60 MG 12 hr capsule [Pharmacy Med Name: DILTIAZEM ER 60MG  CAPSULES (12 HR)] 180 capsule 1    Sig: TAKE 1 CAPSULE(60 MG) BY MOUTH TWICE DAILY      Cardiovascular:  Calcium Channel Blockers Failed - 03/30/2021  3:08 AM      Failed - Last BP in normal range    BP Readings from Last 1 Encounters:  05/16/20 (!) 159/110          Failed - Valid encounter within last 6 months    Recent Outpatient Visits           10 months ago Essential hypertension   [place] Kerin Perna, NP   1 year ago Callous ulcer with fat layer exposed ([place])   [place], [place], NP   2 years ago Nasal congestion   [place] Kerin Perna, NP   2 years ago Essential hypertension   [place], Roger David, PA-C   2 years ago Essential hypertension   [place] Gildardo Pounds, NP

## 2021-04-02 ENCOUNTER — Ambulatory Visit (INDEPENDENT_AMBULATORY_CARE_PROVIDER_SITE_OTHER): Payer: Self-pay | Admitting: *Deleted

## 2021-04-02 NOTE — Telephone Encounter (Signed)
Pt called with complaints of a lump on her left chest/breast pain; the area is tender to the touch; the pain radiates to her collar bone; she rates her pain at 7 out of 10; the area is her normal skin tone; her pain started 03/26/21 but the lump has been present for about a month; recommendations made per nurse triage protocol; she verbalized understanding; she declined [facility] and would like to be seen in the office; she was previously scheduled to see Juluis Mire at [facility] 04/06/21 at 0910; pt requested to be added to wait list if there are any cancellations; pt added per request; she can be contacted at [phone]; will route to office for notification.  Reason for Disposition . [1] Swelling is painful to touch AND [2] no fever  Answer Assessment - Initial Assessment Questions 1. APPEARANCE of SWELLING: "What does it look like?" (e.g., lymph node, insect bite, mole)     Lump; normal skin tone 2. SIZE: "How large is the swelling?" (inches, cm or compare to coins)    Size of 50 cent piece 3. LOCATION: "Where is the swelling located?"    Left chest/breast bone 4. ONSET: "When did the swelling start?"     1 month ago 5. PAIN: "Is it painful?" If Yes, ask: "How much?"     Yes 7 out of 10; stated 04/01/21 6. ITCH: "Does it itch?" If Yes, ask: "How much?"     no 7. CAUSE: "What do you think caused the swelling?"    Not sure 8. OTHER SYMPTOMS: "Do you have any other symptoms?" (e.g., fever)  no  Protocols used: SKIN LUMP OR LOCALIZED SWELLING-A-AH

## 2021-04-02 NOTE — Telephone Encounter (Signed)
Noted. Appointment is on waitlist in case of a cancellation.

## 2021-04-04 ENCOUNTER — Other Ambulatory Visit (INDEPENDENT_AMBULATORY_CARE_PROVIDER_SITE_OTHER): Payer: Self-pay | Admitting: Primary Care

## 2021-04-06 ENCOUNTER — Ambulatory Visit (INDEPENDENT_AMBULATORY_CARE_PROVIDER_SITE_OTHER): Payer: Medicaid Other | Admitting: Primary Care

## 2021-06-03 ENCOUNTER — Other Ambulatory Visit (INDEPENDENT_AMBULATORY_CARE_PROVIDER_SITE_OTHER): Payer: Self-pay | Admitting: Primary Care

## 2021-06-03 NOTE — Telephone Encounter (Signed)
Requested medication (s) are due for refill today: yes  Requested medication (s) are on the active medication list: yes   Last refill:  05/30/21  Future visit scheduled: no  Notes to clinic:  overdue for 6 month follow up  Message sent for patient to contact office.   Requested Prescriptions  Pending Prescriptions Disp Refills   diltiazem (CARDIZEM SR) 60 MG 12 hr capsule [Pharmacy Med Name: DILTIAZEM ER 60MG  CAPSULES (12 HR)] 15 capsule 0    Sig: TAKE 1 CAPSULE(60 MG) BY MOUTH TWICE DAILY      Cardiovascular:  Calcium Channel Blockers Failed - 06/03/2021  3:09 AM      Failed - Last BP in normal range    BP Readings from Last 1 Encounters:  05/16/20 (!) 159/110          Failed - Valid encounter within last 6 months    Recent Outpatient Visits           1 year ago Essential hypertension   [place] Kerin Perna, NP   1 year ago Callous ulcer with fat layer exposed ([place])   [place], [place], NP   2 years ago Nasal congestion   [place] Kerin Perna, NP   2 years ago Essential hypertension   [place], Roger David, PA-C   3 years ago Essential hypertension   [place] Gildardo Pounds, NP

## 2021-06-05 NOTE — Telephone Encounter (Signed)
No future refills until seen

## 2021-06-14 ENCOUNTER — Emergency Department (HOSPITAL_COMMUNITY)
Admission: EM | Admit: 2021-06-14 | Discharge: 2021-06-14 | Disposition: A | Discharge status: Home / Self Care | Payer: Medicaid Other | Attending: Emergency Medicine | Admitting: Emergency Medicine

## 2021-06-14 ENCOUNTER — Emergency Department (HOSPITAL_COMMUNITY): Payer: Medicaid Other

## 2021-06-14 ENCOUNTER — Other Ambulatory Visit: Payer: Self-pay

## 2021-06-14 ENCOUNTER — Encounter (HOSPITAL_COMMUNITY): Payer: Self-pay

## 2021-06-14 DIAGNOSIS — Z79899 Other long term (current) drug therapy: Secondary | ICD-10-CM | POA: Diagnosis not present

## 2021-06-14 DIAGNOSIS — F10929 Alcohol use, unspecified with intoxication, unspecified: Secondary | ICD-10-CM | POA: Diagnosis not present

## 2021-06-14 DIAGNOSIS — F17210 Nicotine dependence, cigarettes, uncomplicated: Secondary | ICD-10-CM | POA: Diagnosis not present

## 2021-06-14 DIAGNOSIS — R079 Chest pain, unspecified: Secondary | ICD-10-CM | POA: Diagnosis present

## 2021-06-14 DIAGNOSIS — R0789 Other chest pain: Secondary | ICD-10-CM

## 2021-06-14 DIAGNOSIS — Z7982 Long term (current) use of aspirin: Secondary | ICD-10-CM | POA: Insufficient documentation

## 2021-06-14 DIAGNOSIS — I10 Essential (primary) hypertension: Secondary | ICD-10-CM | POA: Diagnosis not present

## 2021-06-14 LAB — HEPATIC FUNCTION PANEL
ALT: 20 U/L (ref 0–44)
AST: 20 U/L (ref 15–41)
Albumin: 4.2 g/dL (ref 3.5–5.0)
Alkaline Phosphatase: 59 U/L (ref 38–126)
Bilirubin, Direct: <0.1 mg/dL (ref 0.0–0.2)
Total Bilirubin: 0.4 mg/dL (ref 0.3–1.2)
Total Protein: 7.4 g/dL (ref 6.5–8.1)

## 2021-06-14 LAB — BASIC METABOLIC PANEL
Anion gap: 7 (ref 5–15)
BUN: 13 mg/dL (ref 6–20)
CO2: 22 mmol/L (ref 22–32)
Calcium: 8.8 mg/dL — ABNORMAL LOW (ref 8.9–10.3)
Chloride: 108 mmol/L (ref 98–111)
Creatinine, Ser: 0.75 mg/dL (ref 0.44–1.00)
GFR, Estimated: >60 mL/min (ref >60)
Glucose, Bld: 90 mg/dL (ref 70–99)
Potassium: 3.7 mmol/L (ref 3.5–5.1)
Sodium: 137 mmol/L (ref 135–145)

## 2021-06-14 LAB — CBC
HCT: 41 % (ref 36.0–46.0)
Hemoglobin: 13.4 g/dL (ref 12.0–15.0)
MCH: 29.2 pg (ref 26.0–34.0)
MCHC: 32.7 g/dL (ref 30.0–36.0)
MCV: 89.3 fL (ref 80.0–100.0)
Platelets: 258 10*3/uL (ref 150–400)
RBC: 4.59 MIL/uL (ref 3.87–5.11)
RDW: 14.6 % (ref 11.5–15.5)
WBC: 7.6 10*3/uL (ref 4.0–10.5)
nRBC: 0 % (ref 0.0–0.2)

## 2021-06-14 LAB — TROPONIN I (HIGH SENSITIVITY): Troponin I (High Sensitivity): 2 ng/L (ref <18)

## 2021-06-14 LAB — I-STAT BETA HCG BLOOD, ED (MC, WL, AP ONLY): I-stat hCG, quantitative: <5 m[IU]/mL (ref <5)

## 2021-06-14 LAB — D-DIMER, QUANTITATIVE: D-Dimer, Quant: 0.39 ug/mL-FEU (ref 0.00–0.50)

## 2021-06-14 LAB — LIPASE, BLOOD: Lipase: 33 U/L (ref 11–51)

## 2021-06-14 MED ORDER — ALUM & MAG HYDROXIDE-SIMETH 200-200-20 MG/5ML PO SUSP
30.0000 mL | Freq: Once | ORAL | Status: AC
  Administered 2021-06-14: 30 mL via ORAL
  Filled 2021-06-14: qty 30

## 2021-06-14 MED ORDER — FAMOTIDINE 20 MG PO TABS: 20.0000 mg | ORAL_TABLET | Freq: Two times a day (BID) | ORAL | 0 refills | Status: DC

## 2021-06-14 MED ORDER — LIDOCAINE VISCOUS HCL 2 % MT SOLN
15.0000 mL | Freq: Once | OROMUCOSAL | Status: AC
  Administered 2021-06-14: 15 mL via ORAL
  Filled 2021-06-14: qty 15

## 2021-06-14 NOTE — Discharge Instructions (Addendum)
Continue taking home medications as prescribed. Start taking Pepcid twice a day to help with stomach acid. If you have pain, you may try Tums to help with pain control. You may also try medicines like Tylenol and muscle creams to help with muscular pain. If your pain continues beyond a few days, follow-up with your primary care doctor for recheck. Return to the emergency room if you develop difficulty breathing, sweatiness, severe worsening pain, any new, worsening, or concerning symptoms

## 2021-06-14 NOTE — ED Triage Notes (Signed)
Pt reports chest pain that began last night that radiates to back. Pt endorses worsening pain with deep breathing.

## 2021-06-14 NOTE — ED Provider Notes (Signed)
[place] DEPT Provider Note   CSN: [id number] Arrival date & time: 06/14/21  1148     History Chief Complaint  Patient presents with   Chest Pain    Theresa Arias is a 35 y.o. female presenting for evaluation of chest pain.  Patient states around 7:00 this morning she was awoken from sleep with chest pain.  She states pain is in the middle of her chest, radiates to the left side in the back.  Pain is worse when she takes deep breath in.  She has never had similar.  She is not taking anything for it.  She denies associated fever, cough, shortness of breath, nausea, vomiting, abd  pain, urine symptoms, normal bowel movements.  Denies recent travel, surgeries, immunization, history of cancer, history previous DVT/PE, or hormone use.  She reports a family history of heart problems including early cardiac death in her mother and father.  She smokes cigarettes.  Reports intermittent alcohol use, denies drug use.  She takes aspirin and diltiazem, is not on any other medications.  She denies recent change in activity, heavy lifting, or increased exercise. She has a history of heartburn, states this feels different. She does not take anything for this. She had tacos (not spicy) last night.   HPI     Past Medical History:  Diagnosis Date   Hypertension     Patient Active Problem List   Diagnosis Date Noted   Essential hypertension 10/11/2016    Past Surgical History:  Procedure Laterality Date   HERNIA REPAIR       OB History   No obstetric history on file.     Family History  Problem Relation Age of Onset   Hypertension Mother    Hypertension Father     Social History   Tobacco Use   Smoking status: Every Day    Packs/day: 0.50    Types: Cigarettes   Smokeless tobacco: Never  Substance Use Topics   Alcohol use: Yes    Comment: occ   Drug use: No    Home Medications Prior to Admission medications   Medication Sig Start Date End  Date Taking? Authorizing Provider  famotidine (PEPCID) 20 MG tablet Take 1 tablet (20 mg total) by mouth 2 (two) times daily for 7 days. 06/14/21 06/21/21 Yes Lisamarie Coke, PA-C  aspirin EC 81 MG tablet Take 1 tablet (81 mg total) by mouth daily. Patient not taking: Reported on 05/16/2020 10/17/18   Clent Demark, PA-C  cephALEXin (KEFLEX) 500 MG capsule Take 1 capsule (500 mg total) by mouth 4 (four) times daily. Patient not taking: Reported on 05/16/2020 05/03/20   Delia Heady, PA-C  diltiazem (CARDIZEM SR) 60 MG 12 hr capsule TAKE 1 CAPSULE(60 MG) BY MOUTH TWICE DAILY 06/05/21   Kerin Perna, NP  fexofenadine-pseudoephedrine (ALLEGRA-D ALLERGY & CONGESTION) 60-120 MG 12 hr tablet Take 1 tablet by mouth 2 (two) times daily. Patient not taking: Reported on 05/16/2020 02/13/19   Kerin Perna, NP  fluticasone [facility]) 50 MCG/ACT nasal spray Place 2 sprays into both nostrils daily. Patient not taking: Reported on 05/16/2020 02/13/19   Kerin Perna, NP  ibuprofen (ADVIL) 600 MG tablet Take 1 tablet (600 mg total) by mouth every 6 (six) hours as needed for moderate pain. Patient not taking: Reported on 05/16/2020 05/03/20   Delia Heady, PA-C  sodium chloride (OCEAN) 0.65 % SOLN nasal spray Place 1 spray into both nostrils as needed for up to 30 days  for congestion. 02/13/19 03/15/19  Kerin Perna, NP    Allergies    Patient has no known allergies.  Review of Systems   Review of Systems  Cardiovascular:  Positive for chest pain.  All other systems reviewed and are negative.  Physical Exam Updated Vital Signs BP (!) 146/98   Pulse 60   Temp 98 F (36.7 C) (Oral)   Resp 15   LMP 05/31/2021   SpO2 99%   Physical Exam Vitals and nursing note reviewed.  Constitutional:      General: She is not in acute distress.    Appearance: Normal appearance.     Comments: Resting comfortably in the bed in no acute distress  HENT:     Head: Normocephalic and atraumatic.   Eyes:     Conjunctiva/sclera: Conjunctivae normal.     Pupils: Pupils are equal, round, and reactive to light.  Cardiovascular:     Rate and Rhythm: Normal rate and regular rhythm.     Pulses: Normal pulses.  Pulmonary:     Effort: Pulmonary effort is normal. No respiratory distress.     Breath sounds: Normal breath sounds. No wheezing.     Comments: Speaking in full sentences.  Clear lung sounds in all fields. Tenderness palpation of the left side anterior, lateral, and posterior chest wall.  No rash noted.  No signs of trauma. Chest:     Chest wall: Tenderness present.  Abdominal:     General: There is no distension.     Palpations: Abdomen is soft. There is no mass.     Tenderness: There is no abdominal tenderness. There is no guarding or rebound.  Musculoskeletal:        General: Normal range of motion.     Cervical back: Normal range of motion and neck supple.     Right lower leg: No edema.     Left lower leg: No edema.  Skin:    General: Skin is warm and dry.     Capillary Refill: Capillary refill takes less than 2 seconds.  Neurological:     Mental Status: She is alert and oriented to person, place, and time.  Psychiatric:        Mood and Affect: Mood and affect normal.        Speech: Speech normal.        Behavior: Behavior normal.    ED Results / Procedures / Treatments   Labs (all labs ordered are listed, but only abnormal results are displayed) Labs Reviewed  BASIC METABOLIC PANEL - Abnormal; Notable for the following components:      Result Value   Calcium 8.8 (*)    All other components within normal limits  CBC  HEPATIC FUNCTION PANEL  LIPASE, BLOOD  D-DIMER, QUANTITATIVE  I-STAT BETA HCG BLOOD, ED ([facility], [facility], [facility] ONLY)  TROPONIN I (HIGH SENSITIVITY)    EKG None  Radiology DG Chest 2 View  Result Date: 06/14/2021 CLINICAL DATA:  Chest pain. EXAM: CHEST - 2 VIEW COMPARISON:  None. FINDINGS: The heart size and mediastinal contours are within normal  limits. Both lungs are clear. The visualized skeletal structures are unremarkable. IMPRESSION: No active cardiopulmonary disease. Electronically Signed   By: Fidela Salisbury M.D.   On: 06/14/2021 12:24    Procedures Procedures   Medications Ordered in ED Medications  alum & mag hydroxide-simeth (MAALOX/MYLANTA) 200-200-20 MG/5ML suspension 30 mL (30 mLs Oral Given 06/14/21 1254)    And  lidocaine (XYLOCAINE) 2 % viscous  mouth solution 15 mL (15 mLs Oral Given 06/14/21 1254)    ED Course  I have reviewed the triage vital signs and the nursing notes.  Pertinent labs & imaging results that were available during my care of the patient were reviewed by me and considered in my medical decision making (see chart for details).    MDM Rules/Calculators/A&P                          Patient presenting for evaluation of chest pain.  On exam, patient peers nontoxic.  Pain is reproducible with palpation of the chest wall, consider MSK cause.  However patient reports pain is worse with inspiration, as such comes consider pleurisy/PE.  She does not have any risk factors, but will order dimer to r/o pe.  Also consider ACS as patient smokes and has a family history, though less likely.  EKG, troponin, and chest x-ray ordered.  Consider abdominal cause such as GERD, pancreatitis, gallstones.  History and exam no c/w dissection. Will check labs and reassess.  Labs interpreted by me, overall reassuring.  Kidney, liver, pancreatic function normal.  Troponin negative.  Dimer negative.  Chest x-ray viewed and independently interpreted by me, no pneumonia pneumothorax and effusion.  EKG is nonischemic.  Discussed findings with patient.  On reevaluation after GI cocktail, she reports improvement of symptoms.  Discussed possible gastric cause such as GERD.  Discussed possible MSK cause.  Discussed symptomatic treatment and close follow-up with PCP.  At this time, patient proceed for discharge.  Return precautions  given.  Patient states he understands and agrees to plan   Final Clinical Impression(s) / ED Diagnoses Final diagnoses:  Atypical chest pain    Rx / DC Orders ED Discharge Orders          Ordered    famotidine (PEPCID) 20 MG tablet  2 times daily        06/14/21 1426             Ryli Standlee, PA-C 06/14/21 1428    Lacretia Leigh, MD 06/14/21 1452

## 2021-07-09 ENCOUNTER — Other Ambulatory Visit: Payer: Self-pay

## 2021-07-09 ENCOUNTER — Emergency Department (HOSPITAL_COMMUNITY)
Admission: EM | Admit: 2021-07-09 | Discharge: 2021-07-10 | Disposition: A | Discharge status: Home / Self Care | Payer: Medicaid Other | Attending: Student | Admitting: Student

## 2021-07-09 ENCOUNTER — Encounter (HOSPITAL_COMMUNITY): Payer: Self-pay | Admitting: Emergency Medicine

## 2021-07-09 DIAGNOSIS — J029 Acute pharyngitis, unspecified: Secondary | ICD-10-CM | POA: Diagnosis not present

## 2021-07-09 DIAGNOSIS — Z5321 Procedure and treatment not carried out due to patient leaving prior to being seen by health care provider: Secondary | ICD-10-CM | POA: Insufficient documentation

## 2021-07-09 LAB — GROUP A STREP BY PCR: Group A Strep by PCR: DETECTED — AB

## 2021-07-09 NOTE — ED Notes (Signed)
Pt ambulatory in ED lobby. 

## 2021-07-09 NOTE — ED Triage Notes (Signed)
Pt has swollen lymph nodes on the right side of her neck. Pt has red, swollen tonsils on the right side. Pt states her pain is an 8/10 in her throat and that it hurts to swallow. Pt's symptoms began several days ago. OTC medicine has not helped improve symptoms.

## 2021-07-10 ENCOUNTER — Emergency Department (HOSPITAL_COMMUNITY): Payer: Medicaid Other

## 2021-07-10 ENCOUNTER — Encounter (HOSPITAL_COMMUNITY): Payer: Self-pay | Admitting: Emergency Medicine

## 2021-07-10 ENCOUNTER — Emergency Department (HOSPITAL_COMMUNITY)
Admission: EM | Admit: 2021-07-10 | Discharge: 2021-07-11 | Disposition: A | Discharge status: Home / Self Care | Payer: Medicaid Other | Source: Home / Self Care | Attending: Student | Admitting: Student

## 2021-07-10 DIAGNOSIS — R1310 Dysphagia, unspecified: Secondary | ICD-10-CM | POA: Insufficient documentation

## 2021-07-10 DIAGNOSIS — Z7982 Long term (current) use of aspirin: Secondary | ICD-10-CM | POA: Insufficient documentation

## 2021-07-10 DIAGNOSIS — N9489 Other specified conditions associated with female genital organs and menstrual cycle: Secondary | ICD-10-CM | POA: Insufficient documentation

## 2021-07-10 DIAGNOSIS — Z79899 Other long term (current) drug therapy: Secondary | ICD-10-CM | POA: Insufficient documentation

## 2021-07-10 DIAGNOSIS — F17210 Nicotine dependence, cigarettes, uncomplicated: Secondary | ICD-10-CM | POA: Insufficient documentation

## 2021-07-10 DIAGNOSIS — I10 Essential (primary) hypertension: Secondary | ICD-10-CM | POA: Insufficient documentation

## 2021-07-10 DIAGNOSIS — J029 Acute pharyngitis, unspecified: Secondary | ICD-10-CM | POA: Insufficient documentation

## 2021-07-10 DIAGNOSIS — J020 Streptococcal pharyngitis: Secondary | ICD-10-CM

## 2021-07-10 LAB — CBC WITH DIFFERENTIAL/PLATELET
Abs Immature Granulocytes: 0.05 10*3/uL (ref 0.00–0.07)
Basophils Absolute: 0.1 10*3/uL (ref 0.0–0.1)
Basophils Relative: 1 %
Eosinophils Absolute: 0.2 10*3/uL (ref 0.0–0.5)
Eosinophils Relative: 1 %
HCT: 42.6 % (ref 36.0–46.0)
Hemoglobin: 13.9 g/dL (ref 12.0–15.0)
Immature Granulocytes: 1 %
Lymphocytes Relative: 19 %
Lymphs Abs: 2 10*3/uL (ref 0.7–4.0)
MCH: 29.7 pg (ref 26.0–34.0)
MCHC: 32.6 g/dL (ref 30.0–36.0)
MCV: 91 fL (ref 80.0–100.0)
Monocytes Absolute: 1.3 10*3/uL — ABNORMAL HIGH (ref 0.1–1.0)
Monocytes Relative: 12 %
Neutro Abs: 7.2 10*3/uL (ref 1.7–7.7)
Neutrophils Relative %: 66 %
Platelets: 288 10*3/uL (ref 150–400)
RBC: 4.68 MIL/uL (ref 3.87–5.11)
RDW: 13.6 % (ref 11.5–15.5)
WBC: 10.7 10*3/uL — ABNORMAL HIGH (ref 4.0–10.5)
nRBC: 0 % (ref 0.0–0.2)

## 2021-07-10 LAB — BASIC METABOLIC PANEL
Anion gap: 13 (ref 5–15)
BUN: 16 mg/dL (ref 6–20)
CO2: 26 mmol/L (ref 22–32)
Calcium: 9.5 mg/dL (ref 8.9–10.3)
Chloride: 100 mmol/L (ref 98–111)
Creatinine, Ser: 1 mg/dL (ref 0.44–1.00)
GFR, Estimated: >60 mL/min (ref >60)
Glucose, Bld: 90 mg/dL (ref 70–99)
Potassium: 3.8 mmol/L (ref 3.5–5.1)
Sodium: 139 mmol/L (ref 135–145)

## 2021-07-10 LAB — I-STAT BETA HCG BLOOD, ED (MC, WL, AP ONLY): I-stat hCG, quantitative: <5 m[IU]/mL (ref <5)

## 2021-07-10 MED ORDER — DEXAMETHASONE SODIUM PHOSPHATE 10 MG/ML IJ SOLN
10.0000 mg | Freq: Once | INTRAMUSCULAR | Status: AC
  Administered 2021-07-10: 10 mg via INTRAVENOUS
  Filled 2021-07-10: qty 1

## 2021-07-10 MED ORDER — CLINDAMYCIN PHOSPHATE 300 MG/50ML IV SOLN
300.0000 mg | Freq: Once | INTRAVENOUS | Status: AC
  Administered 2021-07-10: 300 mg via INTRAVENOUS
  Filled 2021-07-10: qty 50

## 2021-07-10 MED ORDER — SODIUM CHLORIDE 0.9 % IV BOLUS
1000.0000 mL | Freq: Once | INTRAVENOUS | Status: AC
  Administered 2021-07-10: 1000 mL via INTRAVENOUS

## 2021-07-10 MED ORDER — IOHEXOL 350 MG/ML SOLN
60.0000 mL | Freq: Once | INTRAVENOUS | Status: AC | PRN
  Administered 2021-07-10: 60 mL via INTRAVENOUS

## 2021-07-10 MED ORDER — KETOROLAC TROMETHAMINE 15 MG/ML IJ SOLN
15.0000 mg | Freq: Once | INTRAMUSCULAR | Status: AC
  Administered 2021-07-10: 15 mg via INTRAVENOUS
  Filled 2021-07-10: qty 1

## 2021-07-10 NOTE — ED Notes (Signed)
Pt called x3 at noted times, pt currently not present or visible in lobby, triage RN notified.

## 2021-07-10 NOTE — ED Triage Notes (Signed)
Patient presents with strep throat. She received her results from a test preformed yesterday and would like treatment.

## 2021-07-10 NOTE — ED Provider Notes (Signed)
[place] DEPT Provider Note   CSN: [id number] Arrival date & time: 07/10/21  1926     History Chief Complaint  Patient presents with   Sore Throat    Theresa Arias is a 35 y.o. female presenting for evaluation of sore throat.  Patient states she has had pain in her throat for 4 days.  She was here yesterday, got swabbed for strep, but did not stay to see a provider or wait for her results.  Results returned today, and she is positive for strep.  She reports her pain is worse on the right side of her throat.  She has change in her voice and difficulty opening her mouth.  She states it is hard to eat and swallow due to pain.  She denies fevers, cough, chest pain, nausea, vomit, abdominal pain.  She has a history of high blood pressure for which she takes medication, no other medical problems.  HPI     Past Medical History:  Diagnosis Date   Hypertension     Patient Active Problem List   Diagnosis Date Noted   Essential hypertension 10/11/2016    Past Surgical History:  Procedure Laterality Date   HERNIA REPAIR       OB History   No obstetric history on file.     Family History  Problem Relation Age of Onset   Hypertension Mother    Hypertension Father     Social History   Tobacco Use   Smoking status: Every Day    Packs/day: 0.50    Types: Cigarettes   Smokeless tobacco: Never  Substance Use Topics   Alcohol use: Yes    Comment: occ   Drug use: No    Home Medications Prior to Admission medications   Medication Sig Start Date End Date Taking? Authorizing Provider  aspirin EC 81 MG tablet Take 1 tablet (81 mg total) by mouth daily. Patient not taking: Reported on 05/16/2020 10/17/18   Clent Demark, PA-C  cephALEXin (KEFLEX) 500 MG capsule Take 1 capsule (500 mg total) by mouth 4 (four) times daily. Patient not taking: Reported on 05/16/2020 05/03/20   Delia Heady, PA-C  diltiazem (CARDIZEM SR) 60 MG 12 hr capsule  TAKE 1 CAPSULE(60 MG) BY MOUTH TWICE DAILY 06/05/21   Kerin Perna, NP  famotidine (PEPCID) 20 MG tablet Take 1 tablet (20 mg total) by mouth 2 (two) times daily for 7 days. 06/14/21 06/21/21  Veronique Warga, PA-C  fexofenadine-pseudoephedrine (ALLEGRA-D ALLERGY & CONGESTION) 60-120 MG 12 hr tablet Take 1 tablet by mouth 2 (two) times daily. Patient not taking: Reported on 05/16/2020 02/13/19   Kerin Perna, NP  fluticasone [facility]) 50 MCG/ACT nasal spray Place 2 sprays into both nostrils daily. Patient not taking: Reported on 05/16/2020 02/13/19   Kerin Perna, NP  ibuprofen (ADVIL) 600 MG tablet Take 1 tablet (600 mg total) by mouth every 6 (six) hours as needed for moderate pain. Patient not taking: Reported on 05/16/2020 05/03/20   Delia Heady, PA-C  sodium chloride (OCEAN) 0.65 % SOLN nasal spray Place 1 spray into both nostrils as needed for up to 30 days for congestion. 02/13/19 03/15/19  Kerin Perna, NP    Allergies    Patient has no known allergies.  Review of Systems   Review of Systems  HENT:  Positive for sore throat, trouble swallowing and voice change.   All other systems reviewed and are negative.  Physical Exam Updated Vital Signs BP [facility] Kitchen)  147/112 (BP Location: Right Arm)   Pulse 97   Temp 98.1 F (36.7 C) (Oral)   Resp 20   Ht 6' (1.829 m)   Wt 72.6 kg   SpO2 98%   BMI 21.70 kg/m   Physical Exam Vitals and nursing note reviewed.  Constitutional:      General: She is not in acute distress.    Appearance: Normal appearance.  HENT:     Head: Normocephalic and atraumatic.     Mouth/Throat:     Pharynx: Uvula midline. Pharyngeal swelling and posterior oropharyngeal erythema present.     Tonsils: 3+ on the right. 1+ on the left.     Comments: Bilateral tonsillar swelling, right significantly more than the left.  No uvular deviation.  Muffled voice.  Trismus.  Handling secretions easily Eyes:     Conjunctiva/sclera: Conjunctivae normal.      Pupils: Pupils are equal, round, and reactive to light.  Cardiovascular:     Rate and Rhythm: Normal rate and regular rhythm.     Pulses: Normal pulses.  Pulmonary:     Effort: Pulmonary effort is normal. No respiratory distress.     Breath sounds: Normal breath sounds. No wheezing.     Comments: Speaking in full sentences.  Clear lung sounds in all fields. Abdominal:     General: There is no distension.     Palpations: Abdomen is soft.     Tenderness: There is no abdominal tenderness.  Musculoskeletal:        General: Normal range of motion.     Cervical back: Normal range of motion and neck supple.  Skin:    General: Skin is warm and dry.     Capillary Refill: Capillary refill takes less than 2 seconds.  Neurological:     Mental Status: She is alert and oriented to person, place, and time.  Psychiatric:        Mood and Affect: Mood and affect normal.        Speech: Speech normal.        Behavior: Behavior normal.    ED Results / Procedures / Treatments   Labs (all labs ordered are listed, but only abnormal results are displayed) Labs Reviewed  CBC WITH DIFFERENTIAL/PLATELET  BASIC METABOLIC PANEL  I-STAT BETA HCG BLOOD, ED ([facility], [facility], [facility] ONLY)    EKG None  Radiology No results found.  Procedures Procedures   Medications Ordered in ED Medications  clindamycin (CLEOCIN) IVPB 300 mg (300 mg Intravenous New Bag/Given 07/10/21 2020)  sodium chloride 0.9 % bolus 1,000 mL (1,000 mLs Intravenous New Bag/Given 07/10/21 2017)  dexamethasone (DECADRON) injection 10 mg (10 mg Intravenous Given 07/10/21 2011)  ketorolac (TORADOL) 15 MG/ML injection 15 mg (15 mg Intravenous Given 07/10/21 2011)    ED Course  I have reviewed the triage vital signs and the nursing notes.  Pertinent labs & imaging results that were available during my care of the patient were reviewed by me and considered in my medical decision making (see chart for details).    MDM Rules/Calculators/A&P                            Patient presenting for evaluation of sore throat.  On exam, patient appears nontoxic.  However she does have significantly worsened swelling on the right side with trismus and muffled voice.  However no uvular deviation or airway compromise.  Likely early peritonsillar abscess.  Will treat with fluids,  Decadron, Toradol, and antibiotics.  Will obtain labs and CT scan.  Labs for leukocytosis of 10, otherwise reassuring.  Unsurprising in the setting of known infection.  CT pending.  Pt signed out to Donavan Foil, PA-C for f/u on CT. Likely will be able to be d/c.   Final Clinical Impression(s) / ED Diagnoses Final diagnoses:  None    Rx / DC Orders ED Discharge Orders     None        Franchot Heidelberg, PA-C 07/10/21 2202    Teressa Lower, MD 07/11/21 [phone]

## 2021-07-10 NOTE — ED Provider Notes (Signed)
35 year old female received a signout from [place] pending CT. Per her HPI:   "Chrissa Mahar is a 35 y.o. female presenting for evaluation of sore throat.  Patient states she has had pain in her throat for 4 days.  She was here yesterday, got swabbed for strep, but did not stay to see a provider or wait for her results.  Results returned today, and she is positive for strep.  She reports her pain is worse on the right side of her throat.  She has change in her voice and difficulty opening her mouth.  She states it is hard to eat and swallow due to pain.  She denies fevers, cough, chest pain, nausea, vomit, abdominal pain.  She has a history of high blood pressure for which she takes medication, no other medical problems."  Physical Exam  BP (!) 146/103   Pulse 81   Temp 98.1 F (36.7 C) (Oral)   Resp 20   Ht 6' (1.829 m)   Wt 72.6 kg   SpO2 99%   BMI 21.70 kg/m   Physical Exam Vitals and nursing note reviewed.  Constitutional:      General: She is not in acute distress.    Appearance: She is well-developed. She is not ill-appearing, toxic-appearing or diaphoretic.  HENT:     Head: Normocephalic and atraumatic.     Mouth/Throat:     Comments: Phonation is normal.  No trismus.  Tolerating secretions without difficulty. Cardiovascular:     Rate and Rhythm: Normal rate.  Pulmonary:     Effort: Pulmonary effort is normal.  Musculoskeletal:        General: Normal range of motion.     Cervical back: Normal range of motion.  Neurological:     Mental Status: She is alert and oriented to person, place, and time.    ED Course/Procedures     Procedures  MDM  35 year old female received at signout from [place] pending CT soft tissues of the neck.  Please see her note for further work-up and medical decision making.  Imaging has been reviewed and independently interpreted by me.  CT with acute tonsillitis/pharyngitis that is more pronounced on the right.  There is no  discrete tonsillar or peritonsillar abscess.   Doubt Ludwick's angina, peritonsillar abscess, retropharyngeal abscess.  Discussed CT findings with patient.  She has been successfully fluid challenge.  She reports that she feels much improved from when she arrived in the ED.  Since there is more pronounced findings on the right side of her CT as compared to the left, will treat the patient with oral clindamycin as opposed to penicillin G for streptococcal pharyngitis as the patient did have a positive strep test.  Patient is requesting liquid prescription, which has been sent.  Discussed home supportive treatment.  We will provide the patient with a referral to ENT for follow-up if symptoms do not resolve.  ER return precautions given.  She is hemodynamically stable and in no acute distress.  Safer discharge home with outpatient follow-up as discussed     Joanne Gavel, PA-C 07/11/21 0047    Ripley Fraise, MD 07/11/21 [phone]

## 2021-07-11 MED ORDER — CLINDAMYCIN PALMITATE HCL 75 MG/5ML PO SOLR: 300.0000 mg | Freq: Three times a day (TID) | ORAL | 0 refills | Status: AC

## 2021-07-11 NOTE — Discharge Instructions (Addendum)
Thank you for allowing me to care for you today in the Emergency Department.   Take 1 dose of clindamycin every 8 hours for the next 10 days.  You can take Motrin and Tylenol for pain.  There are liquid versions available at the pharmacy.  Use the dosing instructions on the bottle.  Follow-up with ear nose and throat if your symptoms do not significantly improve with this regimen in the next 4 to 5 days.  Return to the emergency department if you feel as if your throat is closing, if you become unable to swallow your own saliva, if he develop respiratory distress, if you become unable to open your mouth, or have other new, concerning symptoms.

## 2021-07-11 NOTE — ED Notes (Signed)
Pt given water   Pt passed PO challenge

## 2021-07-15 ENCOUNTER — Encounter (INDEPENDENT_AMBULATORY_CARE_PROVIDER_SITE_OTHER): Payer: Self-pay | Admitting: Primary Care

## 2021-07-30 ENCOUNTER — Inpatient Hospital Stay (INDEPENDENT_AMBULATORY_CARE_PROVIDER_SITE_OTHER): Payer: Medicaid Other | Admitting: Primary Care

## 2021-11-26 ENCOUNTER — Ambulatory Visit (INDEPENDENT_AMBULATORY_CARE_PROVIDER_SITE_OTHER): Payer: Medicaid Other | Admitting: Primary Care

## 2021-11-26 ENCOUNTER — Encounter (INDEPENDENT_AMBULATORY_CARE_PROVIDER_SITE_OTHER): Payer: Self-pay | Admitting: Primary Care

## 2021-11-26 ENCOUNTER — Other Ambulatory Visit: Payer: Self-pay

## 2021-11-26 VITALS — BP 136/97 | HR 76 | Temp 97.9°F | Ht 71.0 in | Wt 163.4 lb

## 2021-11-26 DIAGNOSIS — Z760 Encounter for issue of repeat prescription: Secondary | ICD-10-CM | POA: Diagnosis not present

## 2021-11-26 DIAGNOSIS — I10 Essential (primary) hypertension: Secondary | ICD-10-CM

## 2021-11-26 MED ORDER — DILTIAZEM HCL ER 60 MG PO CP12: 60.0000 mg | ORAL_CAPSULE | Freq: Two times a day (BID) | ORAL | 1 refills | Status: DC

## 2021-11-26 NOTE — Progress Notes (Signed)
Patient has head cold !!! Has pounding headache...has not taking bp pills in over a month..been taking tylenol pm to help her sleep

## 2021-11-26 NOTE — Progress Notes (Signed)
° °  Established Patient Office Visit  Subjective:  Patient ID: Theresa Arias, female    DOB: 1986-03-18  Age: 35 y.o. MRN: [id number]  CC:  Chief Complaint  Patient presents with   Medication Refill    HPI Theresa Arias is a 35 year old underweight female who presents for management of hypertension.  She has been out of medication and since then she has noticed headaches and insomnia.  She denies  shortness of breath, chest pain or lower extremity edema .  Asked why she has not been to her appointment she mention transportation was an issue discussed with her insurance she can call and schedule appointments and information will be provided on AVS.  There has been some changes with Medicaid transportation with the amount of days you need to call to schedule appointments.  However, she will get this information when she reaches out to them.  No problems updated.  No Known Allergies  Past Medical History:  Diagnosis Date   Hypertension     Outpatient Medications Prior to Visit  Medication Sig Dispense Refill   diltiazem (CARDIZEM SR) 60 MG 12 hr capsule TAKE 1 CAPSULE(60 MG) BY MOUTH TWICE DAILY (Patient not taking: Reported on 11/26/2021) 15 capsule 0   No facility-administered medications prior to visit.   Objective:  Review of Systems  Neurological:  Positive for headaches.  All other systems reviewed and are negative. Vitals:   11/26/21 1048  BP: (!) 136/97  Pulse: 76  Temp: 97.9 F (36.6 C)  TempSrc: Temporal  SpO2: 97%  Weight: 163 lb 6.4 oz (74.1 kg)  Height: 5\' 11"  (1.803 m)    Exam General appearance : Awake, alert, not in any distress. Speech Clear. Not toxic looking HEENT: Atraumatic and Normocephalic, pupils equally reactive to light and accomodation Neck: Supple, no JVD. No cervical lymphadenopathy.  Chest: Good air entry bilaterally, no added sounds  CVS: S1 S2 regular, no murmurs.  Abdomen: Bowel sounds present, Non tender and not distended with  no gaurding, rigidity or rebound. Extremities: B/L Lower Ext shows no edema, both legs are warm to touch Neurology: Awake alert, and oriented X 3, Non focal Skin: No Rash  Assessment & Plan  Theresa Arias was seen today for medication refill.  Diagnoses and all orders for this visit:  Essential hypertension Counseled on blood pressure goal of less than 130/80, low-sodium, DASH diet, medication compliance, 150 minutes of moderate intensity exercise per week. Discussed medication compliance, adverse effects.   Other orders/Medication refill -     diltiazem (CARDIZEM SR) 60 MG 12 hr capsule; Take 1 capsule (60 mg total) by mouth 2 (two) times daily.   Follow-up: No follow-ups on file.    Kerin Perna, NP

## 2021-11-26 NOTE — Patient Instructions (Signed)
Community Resources  Advocacy/Legal Legal Aid [place]:  1-866-219-5262  /  336-272-0148  Family Justice Center:  336-641-7233  [facility] 24-hr Crisis line:  336-273-7273  Women's Resource Center, [place]:  336-275-6090  Court Watch (custody):  336-275-2346  [place] [facility]:   336-279-9299    Baby & Breastfeeding Car Seat Inspection @ Various [place] Fire Depts.- call 336-373-2177  [place] Lactation  336-832-6860  [facility] 336-878-6712  WIC: 336-641-3663 ([place]);  336-641-7571 ([place])  La Leche League:  1-877-452-5321   Childcare [place] [facility]: 336-369-5097 ([place]) / 336-887-8224 ([place])  - Child Care Resources/ Referrals/ Scholarships  - Head Start/ Early Head Start (call or apply online)  [place] DHHS: [place] Pre-K :  1-800-859-0829 / 336-274-5437   Employment / Job Search [facility] [place]: 336-275-6090 / [address]  [place] [facility] Career Center (JobLink): 336-373-5922 ([place]) / 336-882-4141 ([place])  [facility] Community Resource/ Career Center: 336-275-9801 / 336-282-7307  [facility] Job & Career Center: 336-373-3764  DHHS Work First: 336-641-3447 ([place]) / 336-641-3447 ([place])  StepUp [facility] [place]:  336-676-5871   Financial Assistance [place] [facility]:  336-553-2657  Salvation Army: 336-235-0368  [employer] Network (furniture):  336-370-4002  [place] Helping Hands: 336-373-4264  Low Income Energy Assistance  336-641-3000   Food Assistance DHHS- SNAP/ Food Stamps: 336-641-4588  WIC: [place]- 336-641-3663 ;  [place] 336-641-7571  Little [place] Book- Free Meals  Little Blue Book- Free Food Pantries  During the summer, text "FOOD" to 877877   General Health / Clinics (Adults) Orange Card (for Adults) through [facility]: (336) 895-4900  [facility]:   336-832-8035  [place] [facility]:   336-832-4444  Health Department:  336-641-3245  [place]:  336-415-3877 / 336-641-2100  Planned Parenthood of [place]:   336-373-0678  [facility]:   336-334-4822 x 50251   Housing [place] Housing Coalition:   336-691-9521  [place] Housing Authority:  336-275-8501  Affordable Housing Managemnt:  336-273-0568   Immigrant/ Refugee Center for [place]ians (UNCG):  336-256-1065  Faith Action International House:  336-379-0037  New Arrivals Institute:  336-937-4701  Church World Services:  336-617-0381  African Services Coalition:  336-574-2677   LGBTQ YouthSAFE  www.youthsafe[place].org  PFLAG  336-541-6754 / info@[place].org  The Trevor Project:  1-866-488-7386   Mental Health/ Substance Use [facility]  336-387-6161   Health:  336-832-9700 or 1-800-711-2635  [facility] Circle of Care:  336-271-5888  [facility]:  336-294-1349  [facility] Care [facility]:  336-542-2884  [facility] (walk-ins)  336-676-6840 / [address]  [place]:  800-449-1287  Alcoholics Anonymous:  336-854-4278  Narcotics Anonymous:  800-365-1036  Quit Smoking Hotline:  800-QUIT-NOW (800-784-8669)   Parenting Children's Home Society:  800-632-1400  [place] [facility] Community: Education Center & Support Groups:  336-832-6682  YWCA: 336-273-3461  UNCG: Bringing Out the Best:  336-334-3120               Thriving at Three (Hispanic families): 336-256-1066  Healthy Start ([facility]):  336-387-6161 x2288  Parents as Teachers:  336-691-0024  [place] [facility]- Learning Together (Immigrants): 336-369-5001   Poison Control 800-222-1222  Sports & Recreation [employer] Open Doors Application: ymcanwnc.org/join/open-doors-financial-assistance/  City of [place] Recreation Centers: http://www.[place].gov/index.aspx?page=3615   Special Needs Family Support Network:  336-832-6507  Autism Society of [place]:   336-333-0197 x1402 or x1412 /  800-785-1035  TEACCH [place]:  336-334-5773     ARC of [place]:  336-373-1076  Children's Developmental Service Agency (CDSA):  336-334-5601  CC4C (Care Coordination for Children):  336-641-7641   Transportation Medicaid Transportation: 336-641-4848 to apply  [place] Transit Authority: 336-335-6499 (reduced-fare bus ID to Medicaid/ Medicare/ Orange Card)  SCAT Paratransit services: Eligible riders only, call 336-333-6589 for application   Tutoring/Mentoring Black Child Development Institute: 336-230-2138  Big Brothers/ Big Sisters: 336-378-9100 ([place])  336-882-4167 ([place])  ACES through child's school: 336-370-2321  [employer] Achievers: contact your local Y  SHIELD Mentor Program: 336-337-2771   

## 2022-02-24 ENCOUNTER — Ambulatory Visit (INDEPENDENT_AMBULATORY_CARE_PROVIDER_SITE_OTHER): Payer: Medicaid Other | Admitting: Primary Care

## 2022-07-27 ENCOUNTER — Encounter (INDEPENDENT_AMBULATORY_CARE_PROVIDER_SITE_OTHER): Payer: Medicaid Other | Admitting: Primary Care

## 2022-11-12 ENCOUNTER — Other Ambulatory Visit (INDEPENDENT_AMBULATORY_CARE_PROVIDER_SITE_OTHER): Payer: Self-pay | Admitting: Primary Care

## 2022-11-12 NOTE — Telephone Encounter (Signed)
Requested medications are due for refill today.  yes  Requested medications are on the active medications list.  yes  Last refill. 11/26/2021 #180 1 rf  Future visit scheduled.   no  Notes to clinic.  Labs are expired. Pt is more tha 3 months overdue for OV.    Requested Prescriptions  Pending Prescriptions Disp Refills   diltiazem (CARDIZEM SR) 60 MG 12 hr capsule [Pharmacy Med Name: DILTIAZEM ER '60MG'$  CAPSULES (12 HR)] 180 capsule 1    Sig: TAKE 1 CAPSULE(60 MG) BY MOUTH TWICE DAILY     Cardiovascular: Calcium Channel Blockers 3 Failed - 11/12/2022  8:30 AM      Failed - ALT in normal range and within 360 days    ALT  Date Value Ref Range Status  06/14/2021 20 0 - 44 U/L Final         Failed - AST in normal range and within 360 days    AST  Date Value Ref Range Status  06/14/2021 20 15 - 41 U/L Final         Failed - Cr in normal range and within 360 days    Creatinine, Ser  Date Value Ref Range Status  07/10/2021 1.00 0.44 - 1.00 mg/dL Final         Failed - Last BP in normal range    BP Readings from Last 1 Encounters:  11/26/21 (!) 136/97         Failed - Valid encounter within last 6 months    Recent Outpatient Visits           11 months ago Essential hypertension   [place] Kerin Perna, NP   2 years ago Essential hypertension   [place], Michelle P, NP   2 years ago Callous ulcer with fat layer exposed ([place])   [place], Olympia Fields, NP   3 years ago Nasal congestion   [place] Kerin Perna, NP   4 years ago Essential hypertension   [place], Roger David, PA-C              Passed - Last Heart Rate in normal range    Pulse Readings from Last 1 Encounters:  11/26/21 76

## 2023-01-12 ENCOUNTER — Telehealth: Payer: Medicaid Other | Admitting: Physician Assistant

## 2023-01-12 DIAGNOSIS — R0789 Other chest pain: Secondary | ICD-10-CM

## 2023-01-12 DIAGNOSIS — R12 Heartburn: Secondary | ICD-10-CM

## 2023-01-12 NOTE — Progress Notes (Signed)
Because of chest pain from heartburn, I feel your condition warrants further evaluation and I recommend that you be seen in a face to face visit. We want to make sure no concern for a gastritis and to make sure no other contributing factors to the atypical chest pain.    NOTE: There will be NO CHARGE for this eVisit   If you are having a true medical emergency please call 911.      For an urgent face to face visit, Theresa Arias has eight urgent care centers for your convenience:   NEW!! [facility] [place] at [place] Get Driving Directions [id number] [address] [place], [place] [facility] [place] at [place] Get Driving Directions [id number] [place], [place] [address]   [place]) Get Driving Directions [id number] [address], [place] [address]  [facility] [place] ([place]) Get Driving Directions [id number] [address] [place],  [place]  [address]  [place] [facility] at [place] Get Driving Directions  [id number] [phone] [place],  [place] [address]   [facility] Get Driving Directions [id number] [address] [place], Theresa Arias [place], [place] [address]   [facility] Get Driving Directions  [id number] [address] [place], [place] [address]   [facility] [place] Get Driving Directions [id number] [address], [place], [place] [address]  Your MyChart E-visit questionnaire answers were reviewed by a board certified advanced clinical practitioner to complete your personal care plan based on your specific symptoms.  Thank you for using e-Visits.

## 2023-01-18 ENCOUNTER — Other Ambulatory Visit: Payer: Self-pay

## 2023-01-18 ENCOUNTER — Emergency Department (HOSPITAL_COMMUNITY): Payer: BLUE CROSS/BLUE SHIELD

## 2023-01-18 ENCOUNTER — Emergency Department (HOSPITAL_COMMUNITY)
Admission: EM | Admit: 2023-01-18 | Discharge: 2023-01-19 | Disposition: A | Discharge status: Home / Self Care | Payer: BLUE CROSS/BLUE SHIELD | Attending: Emergency Medicine | Admitting: Emergency Medicine

## 2023-01-18 ENCOUNTER — Encounter (HOSPITAL_COMMUNITY): Payer: Self-pay | Admitting: Emergency Medicine

## 2023-01-18 DIAGNOSIS — E876 Hypokalemia: Secondary | ICD-10-CM | POA: Diagnosis not present

## 2023-01-18 DIAGNOSIS — I10 Essential (primary) hypertension: Secondary | ICD-10-CM | POA: Diagnosis not present

## 2023-01-18 DIAGNOSIS — F17210 Nicotine dependence, cigarettes, uncomplicated: Secondary | ICD-10-CM | POA: Insufficient documentation

## 2023-01-18 DIAGNOSIS — Z79899 Other long term (current) drug therapy: Secondary | ICD-10-CM | POA: Diagnosis not present

## 2023-01-18 DIAGNOSIS — R079 Chest pain, unspecified: Secondary | ICD-10-CM | POA: Diagnosis not present

## 2023-01-18 LAB — BASIC METABOLIC PANEL
Anion gap: 9 (ref 5–15)
BUN: 12 mg/dL (ref 6–20)
CO2: 20 mmol/L — ABNORMAL LOW (ref 22–32)
Calcium: 7.6 mg/dL — ABNORMAL LOW (ref 8.9–10.3)
Chloride: 108 mmol/L (ref 98–111)
Creatinine, Ser: 0.82 mg/dL (ref 0.44–1.00)
GFR, Estimated: >60 mL/min (ref >60)
Glucose, Bld: 109 mg/dL — ABNORMAL HIGH (ref 70–99)
Potassium: 3 mmol/L — ABNORMAL LOW (ref 3.5–5.1)
Sodium: 137 mmol/L (ref 135–145)

## 2023-01-18 LAB — CBC
HCT: 38.6 % (ref 36.0–46.0)
Hemoglobin: 12.5 g/dL (ref 12.0–15.0)
MCH: 29.3 pg (ref 26.0–34.0)
MCHC: 32.4 g/dL (ref 30.0–36.0)
MCV: 90.6 fL (ref 80.0–100.0)
Platelets: 206 10*3/uL (ref 150–400)
RBC: 4.26 MIL/uL (ref 3.87–5.11)
RDW: 13.8 % (ref 11.5–15.5)
WBC: 7.7 10*3/uL (ref 4.0–10.5)
nRBC: 0 % (ref 0.0–0.2)

## 2023-01-18 LAB — TROPONIN I (HIGH SENSITIVITY)
Troponin I (High Sensitivity): 3 ng/L (ref <18)
Troponin I (High Sensitivity): 4 ng/L (ref <18)

## 2023-01-18 LAB — I-STAT BETA HCG BLOOD, ED (MC, WL, AP ONLY): I-stat hCG, quantitative: <5 m[IU]/mL (ref <5)

## 2023-01-18 MED ORDER — LORAZEPAM 2 MG/ML IJ SOLN: 4.0000 mg | Freq: Once | INTRAMUSCULAR | Status: DC

## 2023-01-18 MED ORDER — ALUM & MAG HYDROXIDE-SIMETH 200-200-20 MG/5ML PO SUSP
30.0000 mL | Freq: Once | ORAL | Status: AC
  Administered 2023-01-18: 30 mL via ORAL
  Filled 2023-01-18: qty 30

## 2023-01-18 MED ORDER — POTASSIUM CHLORIDE CRYS ER 20 MEQ PO TBCR
60.0000 meq | EXTENDED_RELEASE_TABLET | Freq: Once | ORAL | Status: AC
  Administered 2023-01-18: 60 meq via ORAL
  Filled 2023-01-18: qty 3

## 2023-01-18 MED ORDER — KETOROLAC TROMETHAMINE 15 MG/ML IJ SOLN
15.0000 mg | Freq: Once | INTRAMUSCULAR | Status: AC
  Administered 2023-01-18: 15 mg via INTRAVENOUS
  Filled 2023-01-18: qty 1

## 2023-01-18 NOTE — Discharge Instructions (Addendum)
You came to the emergency department today with chest pain.  Your workup is reassuring today.  As we discussed you do have a lower potassium so I would like you to take a supplement over-the-counter.  Your evaluation.  Calcium was also slightly low.  I recommend a One-A-Day women's vitamin.  Please follow-up with your primary care for repeat labs.  They may also reassess your chest pain or refer you to cardiology if they see fit.  Do not hesitate to return with any worsening or recurring symptoms.  It was a pleasure to meet you and we hope you feel better!  A work note is attached if you need it!

## 2023-01-18 NOTE — ED Triage Notes (Signed)
Pt BIB EMS from home with c/o sudden chest pain under left breast after eating and lying down. Pt states that she takes her diltiazem once a day instead of twice.   20 LAC  2 nitro SL, BP 140s, pain 7/10 after.

## 2023-01-18 NOTE — ED Provider Notes (Signed)
[facility] Provider Note   CSN: [id number] Arrival date & time: 01/18/23  2003     History  Chief Complaint  Patient presents with   Chest Pain    Theresa Arias is a 37 y.o. female with a reported past medical history of hypertension presenting today with chest pain.  She reports that at 530 she started to have some sharp chest pain just under her left breast.  Did not radiate.  This is never happened before.  No associated shortness of breath, palpitations, dizziness or presyncope.  She does smoke About 2 cigarettes a day but denies any recent travel, surgery, leg swelling, estrogen use or history of DVT/PE.  She thought it could be indigestion but reports only eating some chicken.   Chest Pain Associated symptoms: no palpitations and no shortness of breath        Home Medications Prior to Admission medications   Medication Sig Start Date End Date Taking? Authorizing Provider  diltiazem (CARDIZEM SR) 60 MG 12 hr capsule Take 1 capsule (60 mg total) by mouth 2 (two) times daily. 11/26/21   Kerin Perna, NP      Allergies    Patient has no known allergies.    Review of Systems   Review of Systems  Respiratory:  Negative for chest tightness and shortness of breath.   Cardiovascular:  Positive for chest pain. Negative for palpitations and leg swelling.    Physical Exam Updated Vital Signs BP (!) 147/94   Pulse (!) 56   Temp 98.3 F (36.8 C)   Resp 17   Ht 5' 11"$  (1.803 m)   Wt 77.1 kg   LMP 12/30/2022   SpO2 100%   BMI 23.71 kg/m  Physical Exam Vitals and nursing note reviewed.  Constitutional:      General: She is not in acute distress.    Appearance: Normal appearance. She is not ill-appearing.  HENT:     Head: Normocephalic and atraumatic.  Eyes:     General: No scleral icterus.    Conjunctiva/sclera: Conjunctivae normal.  Cardiovascular:     Rate and Rhythm: Normal rate and regular rhythm.      Pulses:          Dorsalis pedis pulses are 2+ on the right side and 2+ on the left side.  Pulmonary:     Effort: Pulmonary effort is normal. No respiratory distress.     Breath sounds: Normal breath sounds.  Musculoskeletal:     Right lower leg: No edema.     Left lower leg: No edema.  Skin:    Findings: No rash.  Neurological:     Mental Status: She is alert.  Psychiatric:        Mood and Affect: Mood normal.     ED Results / Procedures / Treatments   Labs (all labs ordered are listed, but only abnormal results are displayed) Labs Reviewed  BASIC METABOLIC PANEL - Abnormal; Notable for the following components:      Result Value   Potassium 3.0 (*)    CO2 20 (*)    Glucose, Bld 109 (*)    Calcium 7.6 (*)    All other components within normal limits  CBC  I-STAT BETA HCG BLOOD, ED (MC, WL, AP ONLY)  TROPONIN I (HIGH SENSITIVITY)  TROPONIN I (HIGH SENSITIVITY)    EKG None  Radiology DG Chest 2 View  Result Date: 01/18/2023 CLINICAL DATA:  Chest pain EXAM:  CHEST - 2 VIEW COMPARISON:  Chest radiograph dated June 14, 2021 FINDINGS: The heart size and mediastinal contours are within normal limits. Both lungs are clear. The visualized skeletal structures are unremarkable. IMPRESSION: No active cardiopulmonary disease. Electronically Signed   By: Keane Police D.O.   On: 01/18/2023 21:47    Procedures Procedures   Medications Ordered in ED Medications  alum & mag hydroxide-simeth (MAALOX/MYLANTA) 200-200-20 MG/5ML suspension 30 mL (30 mLs Oral Given 01/18/23 2213)  ketorolac (TORADOL) 15 MG/ML injection 15 mg (15 mg Intravenous Given 01/18/23 2209)  potassium chloride SA (KLOR-CON M) CR tablet 60 mEq (60 mEq Oral Given 01/18/23 2208)    ED Course/ Medical Decision Making/ A&P                             Medical Decision Making Amount and/or Complexity of Data Reviewed Labs: ordered. Radiology: ordered.  Risk OTC drugs. Prescription drug management.   37 year old  female presenting with chest pain.  No cardiac history. The emergent differential diagnosis of chest pain includes: Acute coronary syndrome, pericarditis, aortic dissection, pulmonary embolism, tension pneumothorax, and esophageal rupture.  This is not an exhaustive differential.    Past Medical History / Co-morbidities / Social History: Hypertension   Additional history: Per chart review patient presented to the emergency department in 2022 for atypical chest pain and had a negative workup at that time.  No recorded outpatient cardiac workup.   Physical Exam: Pertinent physical exam findings include Patient benign  Lab Tests: I ordered, and personally interpreted labs.  The pertinent results include: Calcium 7.6, potassium 3.0   Imaging Studies: I ordered and independently visualized and interpreted chest x-ray and I agree with the radiologist that no acute findings  Medications: I ordered medication including GI cocktail, Toradol and potassium.    MDM/Disposition: This is a 37 year old female presenting with sharp chest pain under the left breast.  Heart score 3, EKG nonischemic.  Troponin negative x 2.  She is low likelihood Wells PE score.  Do not believe she needs any further workup for PE.  X-ray without effusion, pneumothorax, pulmonary edema or pneumonia.  Patient felt somewhat better after GI cocktail, Toradol and she was noted to be mildly hypokalemic.  At this time she does not appear to have an emergent condition requiring cardiology involvement in the emergency department.  She reports that she already has an outpatient family medicine appointment scheduled so she will continue with this.  She is agreeable to this plan and will be discharged.  Final Clinical Impression(s) / ED Diagnoses Final diagnoses:  Hypokalemia  Chest pain, unspecified type    Rx / DC Orders ED Discharge Orders     None      Results and diagnoses were explained to the patient. Return  precautions discussed in full. Patient had no additional questions and expressed complete understanding.   This chart was dictated using voice recognition software.  Despite best efforts to proofread,  errors can occur which can change the documentation meaning.     Darliss Ridgel 01/18/23 2342    Tegeler, Gwenyth Allegra, MD 01/19/23 1600

## 2023-02-08 ENCOUNTER — Telehealth: Payer: Medicaid Other | Admitting: Physician Assistant

## 2023-02-08 DIAGNOSIS — Z91199 Patient's noncompliance with other medical treatment and regimen due to unspecified reason: Secondary | ICD-10-CM

## 2023-02-08 NOTE — Progress Notes (Signed)
The patient no-showed for appointment despite this provider sending direct link, reaching out via phone with no response and waiting for at least 10 minutes from appointment time for patient to join. They will be marked as a NS for this appointment/time.   Kaytelyn Glore M Shabreka Coulon, PA-C    

## 2023-02-20 ENCOUNTER — Encounter (INDEPENDENT_AMBULATORY_CARE_PROVIDER_SITE_OTHER): Payer: Self-pay | Admitting: Primary Care

## 2023-02-28 ENCOUNTER — Encounter (INDEPENDENT_AMBULATORY_CARE_PROVIDER_SITE_OTHER): Payer: Self-pay | Admitting: Primary Care

## 2023-02-28 ENCOUNTER — Ambulatory Visit (INDEPENDENT_AMBULATORY_CARE_PROVIDER_SITE_OTHER): Payer: BLUE CROSS/BLUE SHIELD | Admitting: Primary Care

## 2023-02-28 VITALS — BP 130/78 | HR 72 | Resp 16 | Ht 70.0 in | Wt 167.6 lb

## 2023-02-28 DIAGNOSIS — I10 Essential (primary) hypertension: Secondary | ICD-10-CM | POA: Diagnosis not present

## 2023-02-28 DIAGNOSIS — Z760 Encounter for issue of repeat prescription: Secondary | ICD-10-CM | POA: Diagnosis not present

## 2023-02-28 MED ORDER — DILTIAZEM HCL ER 60 MG PO CP12: 60.0000 mg | ORAL_CAPSULE | Freq: Two times a day (BID) | ORAL | 1 refills | Status: DC

## 2023-02-28 NOTE — Progress Notes (Unsigned)
  [place], is a 37 y.o. female  [id number]  [id number]  DOB - 04/19/1986  No chief complaint on file.      Subjective:   Ms. Theresa Arias is a 37 y.o. female here today for a follow up visit for HTN. She has been out of Bp meds for 5 days.  Patient has No headache, No chest pain, No abdominal pain - No Nausea, No new weakness tingling or numbness, No Cough - shortness of breath  No problems updated.  No Known Allergies  Past Medical History:  Diagnosis Date   Hypertension     Current Outpatient Medications on File Prior to Visit  Medication Sig Dispense Refill   diltiazem (CARDIZEM SR) 60 MG 12 hr capsule Take 1 capsule (60 mg total) by mouth 2 (two) times daily. 180 capsule 1   No current facility-administered medications on file prior to visit.    Objective:    Comprehensive ROS Pertinent positive and negative noted in HPI   Exam General appearance : Awake, alert, not in any distress. Speech Clear. Not toxic looking HEENT: Atraumatic and Normocephalic, pupils equally reactive to light and accomodation. Left ear cerumen impaction Neck: Supple, no JVD. No cervical lymphadenopathy.  Chest: Good air entry bilaterally, no added sounds  CVS: S1 S2 regular, no murmurs.  Abdomen: Bowel sounds present, Non tender and not distended with no gaurding, rigidity or rebound. Extremities: B/L Lower Ext shows no edema, both legs are warm to touch Neurology: Awake alert, and oriented X 3, CN II-XII intact, Non focal Skin: No Rash  Data Review No results found for: "HGBA1C"  Assessment & Plan  Diagnoses and all orders for this visit:   Essential hypertension BP goal - < 130/80 Explained that having normal blood pressure is the goal and medications are helping to get to goal and maintain normal blood pressure. DIET: Limit salt intake, read nutrition labels to check salt content, limit fried and high fatty foods  Avoid using  multisymptom OTC cold preparations that generally contain sudafed which can rise BP. Consult with pharmacist on best cold relief products to use for persons with HTN EXERCISE Discussed incorporating exercise such as walking - 30 minutes most days of the week and can do in 10 minute intervals     Medication refill diltiazem (CARDIZEM SR) 60 MG 12 hr capsule      Patient have been counseled extensively about nutrition and exercise. Other issues discussed during this visit include: low cholesterol diet, weight control and daily exercise, foot care, annual eye examinations at Ophthalmology, importance of adherence with medications and regular follow-up. We also discussed long term complications of uncontrolled diabetes and hypertension.   No follow-ups on file.  The patient was given clear instructions to go to ER or return to medical center if symptoms don't improve, worsen or new problems develop. The patient verbalized understanding. The patient was told to call to get lab results if they haven't heard anything in the next week.   This note has been created with Surveyor, quantity. Any transcriptional errors are unintentional.   Kerin Perna, NP 02/28/2023, 4:13 PM

## 2023-03-01 ENCOUNTER — Encounter (INDEPENDENT_AMBULATORY_CARE_PROVIDER_SITE_OTHER): Payer: Self-pay | Admitting: Primary Care

## 2023-03-22 ENCOUNTER — Encounter (INDEPENDENT_AMBULATORY_CARE_PROVIDER_SITE_OTHER): Payer: Self-pay

## 2023-03-22 ENCOUNTER — Telehealth (INDEPENDENT_AMBULATORY_CARE_PROVIDER_SITE_OTHER): Payer: Self-pay | Admitting: Primary Care

## 2023-03-22 ENCOUNTER — Ambulatory Visit (INDEPENDENT_AMBULATORY_CARE_PROVIDER_SITE_OTHER): Payer: BLUE CROSS/BLUE SHIELD

## 2023-03-22 NOTE — Telephone Encounter (Signed)
Patient was called and appointment was scheduled.

## 2023-03-25 ENCOUNTER — Ambulatory Visit (INDEPENDENT_AMBULATORY_CARE_PROVIDER_SITE_OTHER): Payer: BLUE CROSS/BLUE SHIELD

## 2023-07-21 ENCOUNTER — Telehealth: Payer: Medicaid Other | Admitting: Nurse Practitioner

## 2023-07-21 DIAGNOSIS — J029 Acute pharyngitis, unspecified: Secondary | ICD-10-CM | POA: Diagnosis not present

## 2023-07-21 DIAGNOSIS — Z20818 Contact with and (suspected) exposure to other bacterial communicable diseases: Secondary | ICD-10-CM | POA: Diagnosis not present

## 2023-07-21 MED ORDER — AMOXICILLIN 400 MG/5ML PO SUSR: 500.0000 mg | Freq: Two times a day (BID) | ORAL | 0 refills | Status: AC

## 2023-07-21 MED ORDER — LIDOCAINE VISCOUS HCL 2 % MT SOLN: 5.0000 mL | OROMUCOSAL | 0 refills | Status: AC | PRN

## 2023-07-21 NOTE — Progress Notes (Signed)
Virtual Visit Consent   Theresa Arias, you are scheduled for a virtual visit with a [facility] provider today. Just as with appointments in the office, your consent must be obtained to participate. Your consent will be active for this visit and any virtual visit you may have with one of our providers in the next 365 days. If you have a MyChart account, a copy of this consent can be sent to you electronically.  As this is a virtual visit, video technology does not allow for your provider to perform a traditional examination. This may limit your provider's ability to fully assess your condition. If your provider identifies any concerns that need to be evaluated in person or the need to arrange testing (such as labs, EKG, etc.), we will make arrangements to do so. Although advances in technology are sophisticated, we cannot ensure that it will always work on either your end or our end. If the connection with a video visit is poor, the visit may have to be switched to a telephone visit. With either a video or telephone visit, we are not always able to ensure that we have a secure connection.  By engaging in this virtual visit, you consent to the provision of healthcare and authorize for your insurance to be billed (if applicable) for the services provided during this visit. Depending on your insurance coverage, you may receive a charge related to this service.  I need to obtain your verbal consent now. Are you willing to proceed with your visit today? Theresa Arias has provided verbal consent on 07/21/2023 for a virtual visit (video or telephone). Viviano Simas, FNP  Date: 07/21/2023 4:25 PM  Virtual Visit via Video Note   I, Viviano Simas, connected with  Theresa Arias  ([id number], 01/28/1986) on 07/21/23 at  4:30 PM EDT by a video-enabled telemedicine application and verified that I am speaking with the correct person using two identifiers.  Location: Patient: Virtual Visit Location Patient:  Home Provider: Virtual Visit Location Provider: Home Office   I discussed the limitations of evaluation and management by telemedicine and the availability of in person appointments. The patient expressed understanding and agreed to proceed.    History of Present Illness: Theresa Arias is a 37 y.o. who identifies as a female who was assigned female at birth, and is being seen today for a sore throat  Symptom onset was yesterday  She has had a low grade fever  She notices the back of her throat is white  She does have a runny nose  She does have a slight cough    Her son was sick last week with strep throat and she is concerned that she may have been exposed   She has not taken a COVID test   Is able to eat and drink   Problems:  Patient Active Problem List   Diagnosis Date Noted   Essential hypertension 10/11/2016    Allergies: No Known Allergies Medications:  Current Outpatient Medications:    diltiazem (CARDIZEM SR) 60 MG 12 hr capsule, Take 1 capsule (60 mg total) by mouth 2 (two) times daily., Disp: 180 capsule, Rfl: 1  Observations/Objective: Patient is well-developed, well-nourished in no acute distress.  Resting comfortably  at home.  Head is normocephalic, atraumatic.  No labored breathing.  Speech is clear and coherent with logical content.  Patient is alert and oriented at baseline.    Assessment and Plan:  Patient requested liquid medicine as it is difficult for her to swallow  pills  Continue to push fluids and rest  May consider COVID testing for evolving URI symptoms   1. Streptococcus exposure  - amoxicillin (AMOXIL) 400 MG/5ML suspension; Take 6.3 mLs (500 mg total) by mouth 2 (two) times daily for 10 days.  Dispense: 126 mL; Refill: 0  2. Pharyngitis, unspecified etiology  - amoxicillin (AMOXIL) 400 MG/5ML suspension; Take 6.3 mLs (500 mg total) by mouth 2 (two) times daily for 10 days.  Dispense: 126 mL; Refill: 0     Follow Up  Instructions: I discussed the assessment and treatment plan with the patient. The patient was provided an opportunity to ask questions and all were answered. The patient agreed with the plan and demonstrated an understanding of the instructions.  A copy of instructions were sent to the patient via MyChart unless otherwise noted below.    The patient was advised to call back or seek an in-person evaluation if the symptoms worsen or if the condition fails to improve as anticipated.  Time:  I spent 10 minutes with the patient via telehealth technology discussing the above problems/concerns.    Viviano Simas, FNP

## 2023-07-21 NOTE — Progress Notes (Signed)
Requesting mouth wash from earlier apt. See previous note   Meds ordered this encounter  Medications   lidocaine (XYLOCAINE) 2 % solution    Sig: Use as directed 5 mLs in the mouth or throat every 4 (four) hours as needed for up to 5 days for mouth pain (swish/gargle and spit).    Dispense:  100 mL    Refill:  0

## 2023-07-27 ENCOUNTER — Ambulatory Visit (INDEPENDENT_AMBULATORY_CARE_PROVIDER_SITE_OTHER): Payer: BLUE CROSS/BLUE SHIELD | Admitting: Primary Care

## 2023-08-05 IMAGING — CT CT NECK W/ CM
3 of 4 series · 13 of 33 positions shown, 16 images · IV contrast (omnipaque)
Comparison: None.

CLINICAL DATA: Initial evaluation for acute epiglottitis or
tonsillitis.

EXAM:
CT NECK WITH CONTRAST
TECHNIQUE: Multidetector CT imaging of the neck was performed using the
standard protocol following the bolus administration of intravenous
contrast.
CONTRAST:  60mL OMNIPAQUE IOHEXOL 350 MG/ML SOLN

[Series 2: axial neck · axial · 0.47mm/px · z∈[-223,-67]mm · 5 of 118 slices shown, 7 images]
[im 20/118  soft-tissue]
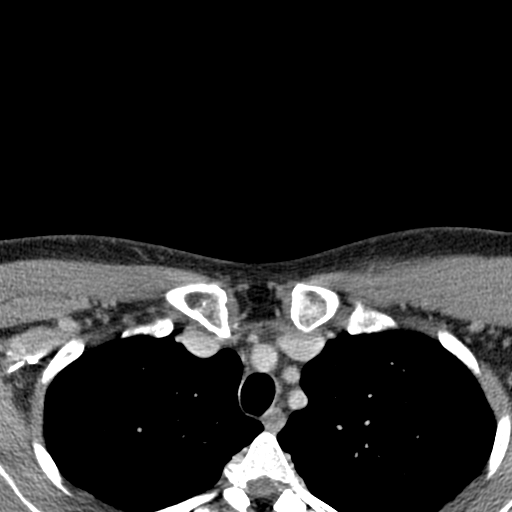
[im 20/118  bone]
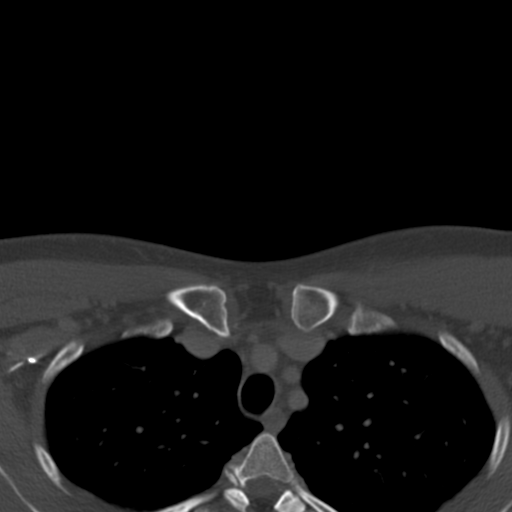
[im 40/118  bone]
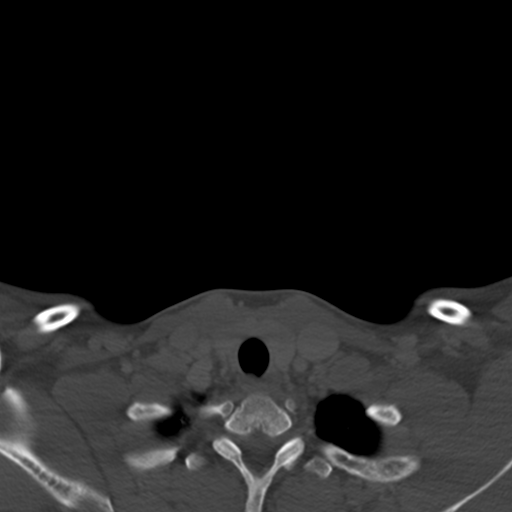
[im 59/118  bone]
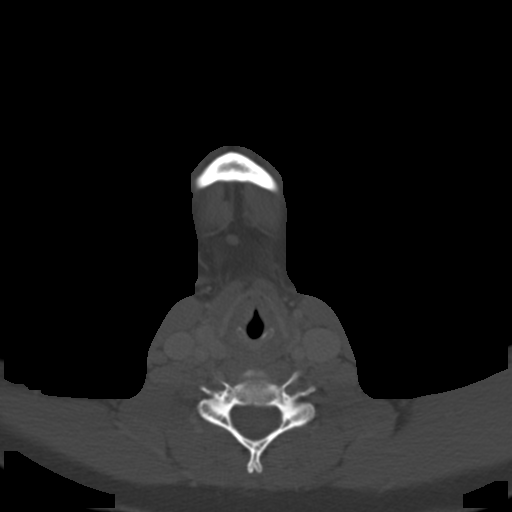
[im 79/118  bone]
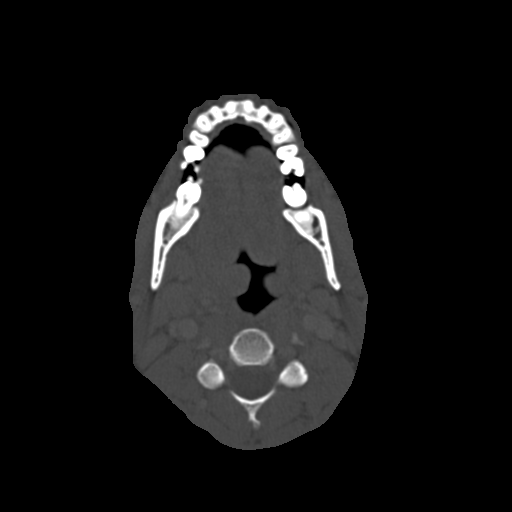
[im 98/118  soft-tissue]
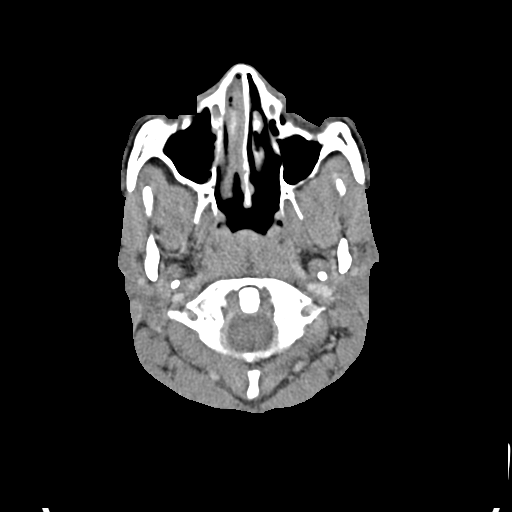
[im 98/118  bone]
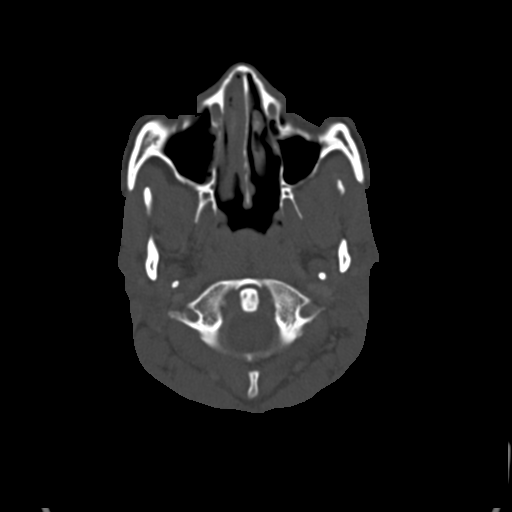

[Series 6: coronal · coronal · 0.34mm/px · 3 of 124 slices shown]
[im 25/124  bone]
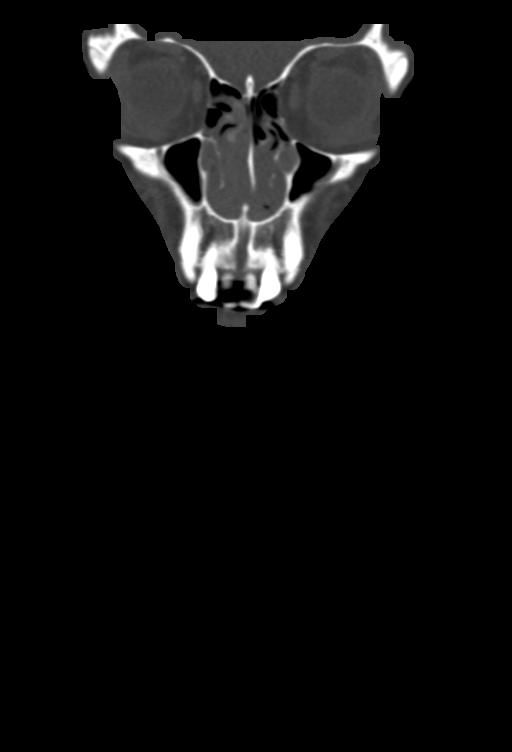
[im 50/124  bone]
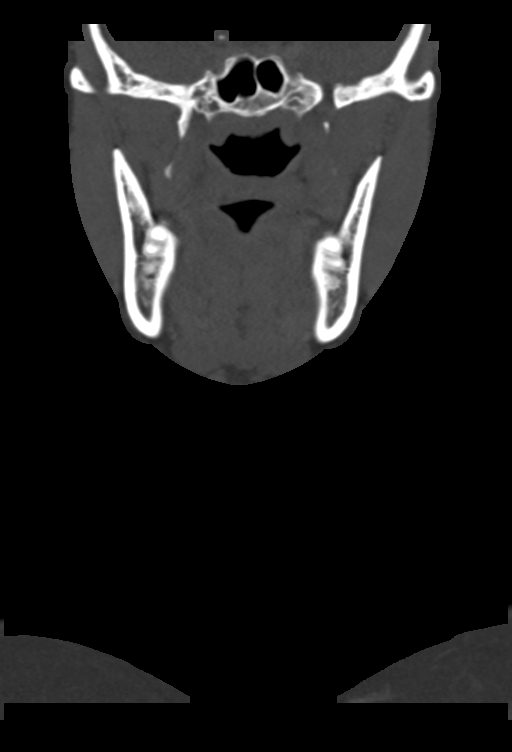
[im 74/124  bone]
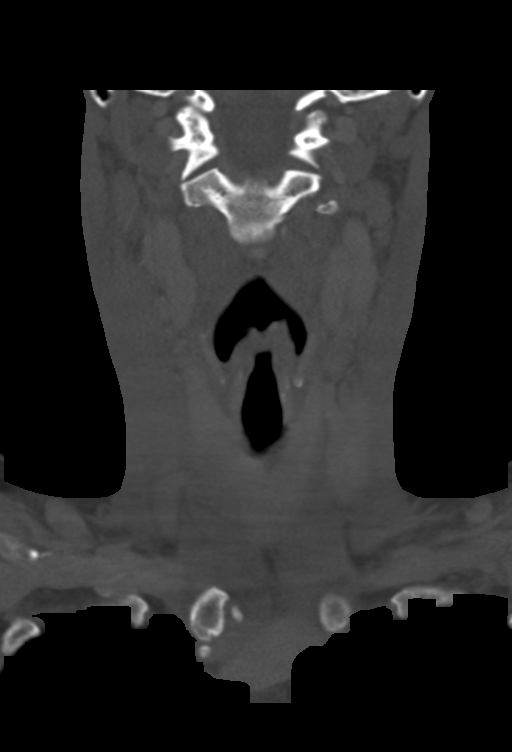

[Series 7: sagittal · sagittal · 0.49mm/px · 5 of 87 slices shown, 6 images]
[im 29/87  bone]
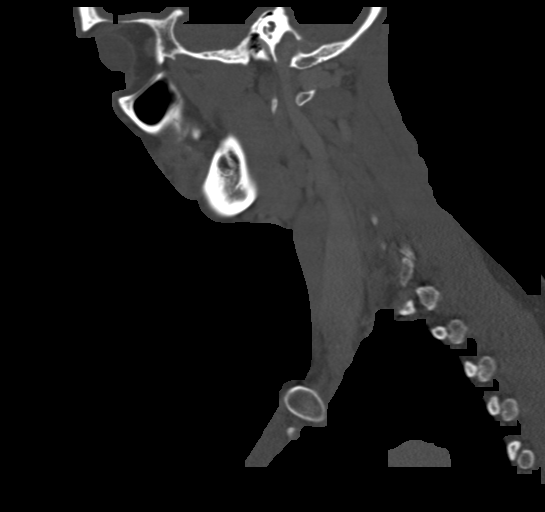
[im 36/87  bone]
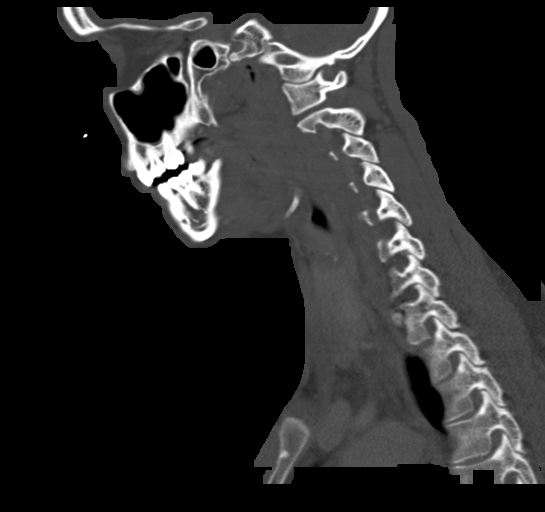
[im 44/87  soft-tissue]
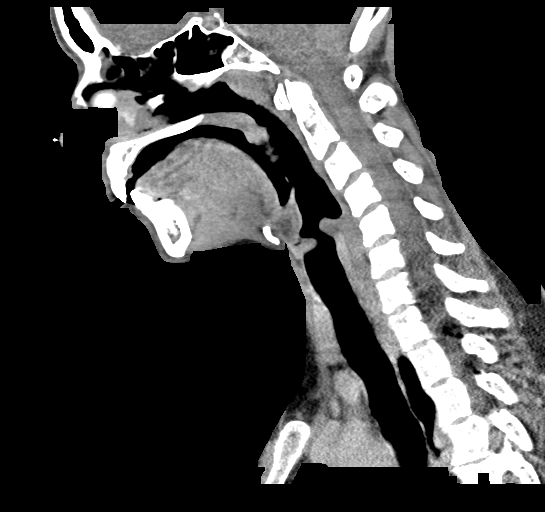
[im 44/87  bone]
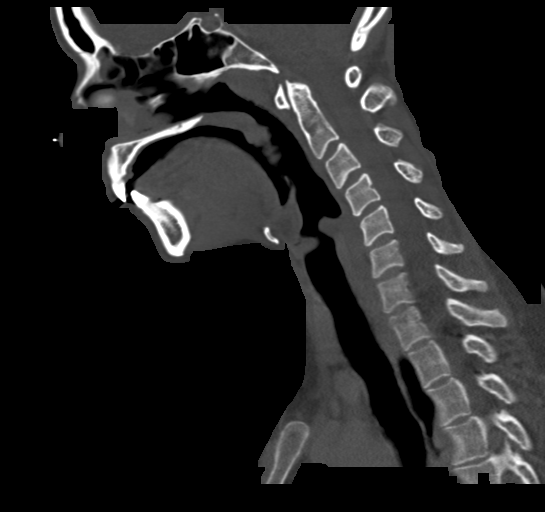
[im 51/87  bone]
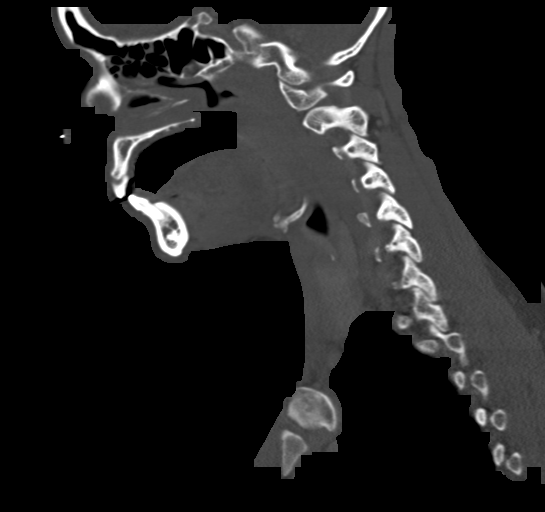
[im 58/87  bone]
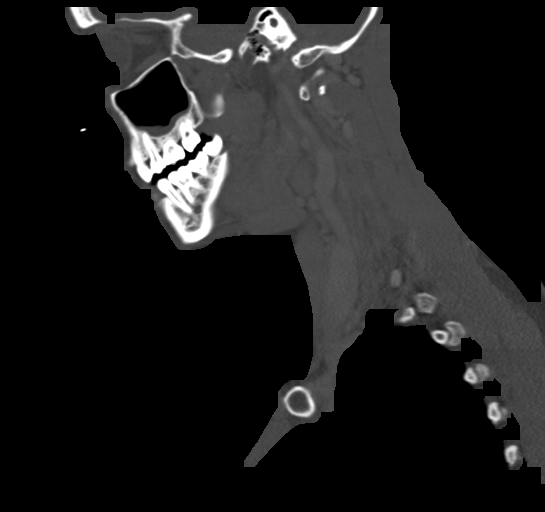

[13 of 33 positions shown; findings below may reference images not displayed]

FINDINGS: Pharynx and larynx: Oral cavity within normal limits. Palatine
tonsils are enlarged and hypertrophied bilaterally, suggesting acute
tonsillitis. Changes are more pronounced on the right. No discrete
tonsillar or peritonsillar abscess. Adenoidal soft tissue somewhat
prominent and hypertrophied as well. Minimal mucosal thickening
within the right oropharynx. Associated mild soft tissue stranding
and induration within the adjacent right parapharyngeal space. No
retropharyngeal collection or swelling. Epiglottis itself within
normal limits without evidence for supraglottitis. Remainder of the
hypopharynx and supraglottic larynx within normal limits. Glottis
normal. Subglottic airway patent clear.

Salivary glands: Salivary glands including the parotid and
submandibular glands are normal.

Thyroid: Normal.

Lymph nodes: Mildly enlarged right level 2 lymph nodes measure up to
1.3 cm, likely reactive. No other pathologically enlarged lymph
nodes within the neck.

Vascular: Normal intravascular enhancement seen throughout the neck.

Limited intracranial: Unremarkable.

Visualized orbits: Unremarkable.

Mastoids and visualized paranasal sinuses: Mild scattered mucosal
thickening noted within the ethmoidal air cells and maxillary
sinuses. Soft tissue density partially fills and occludes the right
greater than left nasal cavities (series 2, image 25), nonspecific.
Visualized mastoids and middle ear cavities are clear.

Skeleton: No acute osseous finding. No discrete or worrisome osseous
lesions.

Upper chest: Unremarkable.  Visualized lungs are clear.

Other: None.
IMPRESSION: 1. Findings consistent with acute tonsillitis/pharyngitis, more
pronounced on the right. No discrete tonsillar or peritonsillar
abscess.
2. Mildly enlarged right level 2 lymph nodes, likely reactive.
3. Soft tissue density partially filling and occluding the right
greater than left nasal cavities, nonspecific. While this appearance
may be secondary to mucosal hypertrophy and/or retained secretions,
possible polyps or other lesion could also have this appearance.
Correlation with physical exam recommended.

## 2023-08-09 ENCOUNTER — Ambulatory Visit (INDEPENDENT_AMBULATORY_CARE_PROVIDER_SITE_OTHER): Payer: Self-pay | Admitting: *Deleted

## 2023-08-09 NOTE — Telephone Encounter (Signed)
Will forward to provider for recommendations 

## 2023-08-09 NOTE — Telephone Encounter (Signed)
  Chief Complaint: right low abdominal pain x 2 months  Symptoms: right low abdominal pain reports as constant , periods worsening . Color darker in color and grape size clots. Cramping noted before periods and after periods completed. Periods last approx 4 days. Fatigue, pain low abd. After intercourse. Severe pain at times. Reports hx endometriosis with family   Frequency: 2  months getting worse Pertinent Negatives: Patient denies fever Disposition: [] ED /[] Urgent Care (no appt availability in office) / [x] Appointment(In office/virtual)/ []  [place] [facility]/ [] Home Care/ [] Refused Recommended Disposition /[]  Mobile Bus/ []  Follow-up with PCP Additional Notes:   Recommended ED for severe pain patient reports she wants appt. Appt scheduled for earliest available 08/17/23. Recommended if pain worsens constant and severe go to ED. Please advise if earlier appt available  Patient requesting a call back.      Reason for Disposition  [1] MILD-MODERATE pain AND [2] constant AND [3] present > 2 hours  Answer Assessment - Initial Assessment Questions 1. LOCATION: "Where does it hurt?"      Right low abdominal cramping  2. RADIATION: "Does the pain shoot anywhere else?" (e.g., chest, back)     Pressure down to pelvic area  3. ONSET: "When did the pain begin?" (e.g., minutes, hours or days ago)      Constant at times x 2 months ago  4. SUDDEN: "Gradual or sudden onset?"     Gradual worsening periods  5. PATTERN "Does the pain come and go, or is it constant?"    - If it comes and goes: "How long does it last?" "Do you have pain now?"     (Note: Comes and goes means the pain is intermittent. It goes away completely between bouts.)    - If constant: "Is it getting better, staying the same, or getting worse?"      (Note: Constant means the pain never goes away completely; most serious pain is constant and gets worse.)      Feels like period is coming on  and constant pain , severe  at times.  6. SEVERITY: "How bad is the pain?"  (e.g., Scale 1-10; mild, moderate, or severe)    - MILD (1-3): Doesn't interfere with normal activities, abdomen soft and not tender to touch.     - MODERATE (4-7): Interferes with normal activities or awakens from sleep, abdomen tender to touch.     - SEVERE (8-10): Excruciating pain, doubled over, unable to do any normal activities.       Severe, at times. Interferes with normal activities  7. RECURRENT SYMPTOM: "Have you ever had this type of stomach pain before?" If Yes, ask: "When was the last time?" and "What happened that time?"      Yes 8. CAUSE: "What do you think is causing the stomach pain?"     Not sure possible hx endometriosis 9. RELIEVING/AGGRAVATING FACTORS: "What makes it better or worse?" (e.g., antacids, bending or twisting motion, bowel movement)     Tylenol pm 10. OTHER SYMPTOMS: "Do you have any other symptoms?" (e.g., back pain, diarrhea, fever, urination pain, vomiting)       Low right  abdominal pain severe at times. Fatigue, period color darker and grape size clots. Pain after intercourse cramping.  11. PREGNANCY: "Is there any chance you are pregnant?" "When was your last menstrual period?"       Just finished period and still has pain  Protocols used: Abdominal Pain - [facility]

## 2023-08-11 ENCOUNTER — Other Ambulatory Visit (INDEPENDENT_AMBULATORY_CARE_PROVIDER_SITE_OTHER): Payer: Self-pay | Admitting: Primary Care

## 2023-08-11 DIAGNOSIS — N946 Dysmenorrhea, unspecified: Secondary | ICD-10-CM

## 2023-08-11 NOTE — Telephone Encounter (Signed)
Contacted pt and made aware  

## 2023-08-17 ENCOUNTER — Ambulatory Visit (INDEPENDENT_AMBULATORY_CARE_PROVIDER_SITE_OTHER): Payer: Medicaid Other | Admitting: Primary Care

## 2023-10-03 ENCOUNTER — Emergency Department (HOSPITAL_COMMUNITY)
Admission: EM | Admit: 2023-10-03 | Discharge: 2023-10-03 | Disposition: A | Discharge status: Home / Self Care | Payer: Medicaid Other | Attending: Emergency Medicine | Admitting: Emergency Medicine

## 2023-10-03 ENCOUNTER — Encounter (HOSPITAL_COMMUNITY): Payer: Self-pay | Admitting: Emergency Medicine

## 2023-10-03 ENCOUNTER — Other Ambulatory Visit: Payer: Self-pay

## 2023-10-03 ENCOUNTER — Emergency Department (HOSPITAL_COMMUNITY): Payer: Medicaid Other

## 2023-10-03 DIAGNOSIS — R1031 Right lower quadrant pain: Secondary | ICD-10-CM | POA: Insufficient documentation

## 2023-10-03 DIAGNOSIS — I10 Essential (primary) hypertension: Secondary | ICD-10-CM | POA: Diagnosis not present

## 2023-10-03 DIAGNOSIS — R1030 Lower abdominal pain, unspecified: Secondary | ICD-10-CM

## 2023-10-03 LAB — CBC WITH DIFFERENTIAL/PLATELET
Abs Immature Granulocytes: 0.02 10*3/uL (ref 0.00–0.07)
Basophils Absolute: 0.1 10*3/uL (ref 0.0–0.1)
Basophils Relative: 1 %
Eosinophils Absolute: 0.3 10*3/uL (ref 0.0–0.5)
Eosinophils Relative: 3 %
HCT: 40.1 % (ref 36.0–46.0)
Hemoglobin: 13 g/dL (ref 12.0–15.0)
Immature Granulocytes: 0 %
Lymphocytes Relative: 27 %
Lymphs Abs: 2.2 10*3/uL (ref 0.7–4.0)
MCH: 28.5 pg (ref 26.0–34.0)
MCHC: 32.4 g/dL (ref 30.0–36.0)
MCV: 87.9 fL (ref 80.0–100.0)
Monocytes Absolute: 0.7 10*3/uL (ref 0.1–1.0)
Monocytes Relative: 9 %
Neutro Abs: 4.9 10*3/uL (ref 1.7–7.7)
Neutrophils Relative %: 60 %
Platelets: 325 10*3/uL (ref 150–400)
RBC: 4.56 MIL/uL (ref 3.87–5.11)
RDW: 14.7 % (ref 11.5–15.5)
WBC: 8.2 10*3/uL (ref 4.0–10.5)
nRBC: 0 % (ref 0.0–0.2)

## 2023-10-03 LAB — URINALYSIS, ROUTINE W REFLEX MICROSCOPIC
Bilirubin Urine: NEGATIVE
Glucose, UA: NEGATIVE mg/dL
Hgb urine dipstick: NEGATIVE
Ketones, ur: 5 mg/dL — AB
Leukocytes,Ua: NEGATIVE
Nitrite: NEGATIVE
Protein, ur: NEGATIVE mg/dL
Specific Gravity, Urine: 1.024 (ref 1.005–1.030)
pH: 6 (ref 5.0–8.0)

## 2023-10-03 LAB — COMPREHENSIVE METABOLIC PANEL
ALT: 26 U/L (ref 0–44)
AST: 18 U/L (ref 15–41)
Albumin: 4.3 g/dL (ref 3.5–5.0)
Alkaline Phosphatase: 67 U/L (ref 38–126)
Anion gap: 9 (ref 5–15)
BUN: 15 mg/dL (ref 6–20)
CO2: 26 mmol/L (ref 22–32)
Calcium: 8.9 mg/dL (ref 8.9–10.3)
Chloride: 102 mmol/L (ref 98–111)
Creatinine, Ser: 0.81 mg/dL (ref 0.44–1.00)
GFR, Estimated: >60 mL/min (ref >60)
Glucose, Bld: 100 mg/dL — ABNORMAL HIGH (ref 70–99)
Potassium: 3.5 mmol/L (ref 3.5–5.1)
Sodium: 137 mmol/L (ref 135–145)
Total Bilirubin: 0.5 mg/dL (ref <1.2)
Total Protein: 8 g/dL (ref 6.5–8.1)

## 2023-10-03 LAB — HCG, SERUM, QUALITATIVE: Preg, Serum: NEGATIVE

## 2023-10-03 MED ORDER — KETOROLAC TROMETHAMINE 30 MG/ML IJ SOLN
30.0000 mg | Freq: Once | INTRAMUSCULAR | Status: AC
  Administered 2023-10-03: 30 mg via INTRAVENOUS
  Filled 2023-10-03: qty 1

## 2023-10-03 MED ORDER — KETOROLAC TROMETHAMINE 30 MG/ML IJ SOLN: 30.0000 mg | Freq: Once | INTRAMUSCULAR | Status: DC

## 2023-10-03 MED ORDER — IBUPROFEN 600 MG PO TABS: 600.0000 mg | ORAL_TABLET | Freq: Four times a day (QID) | ORAL | 0 refills | Status: DC | PRN

## 2023-10-03 MED ORDER — IOHEXOL 300 MG/ML  SOLN
100.0000 mL | Freq: Once | INTRAMUSCULAR | Status: AC | PRN
  Administered 2023-10-03: 100 mL via INTRAVENOUS

## 2023-10-03 NOTE — ED Provider Triage Note (Cosign Needed)
Emergency Medicine Provider Triage Evaluation Note  Theresa Arias , a 37 y.o. female  was evaluated in triage.  Pt complains of bilateral lower abdominal pain over the past 4 weeks that worsens with menstrual cycle.  She denies abnormal menstrual bleeding, vaginal discharge, odor, pruritus, fever.  Review of Systems  Positive: Lower abdominal pain Negative: abnormal menstrual bleeding, vaginal discharge, odor, pruritus, fever.  Physical Exam  BP (!) 151/102 (BP Location: Left Arm)   Pulse 64   Temp 98.8 F (37.1 C) (Oral)   Resp 20   Ht 5\' 9"  (1.753 m)   Wt 79.4 kg   LMP 09/24/2023   SpO2 99%   BMI 25.84 kg/m  Gen:   Awake, no distress   Resp:  Normal effort  MSK:   Moves extremities without difficulty  Other:    Medical Decision Making  Medically screening exam initiated at 3:06 PM.  Appropriate orders placed.  Theresa Arias was informed that the remainder of the evaluation will be completed by another provider, this initial triage assessment does not replace that evaluation, and the importance of remaining in the ED until their evaluation is complete.  Labwork ordered   Judithann Sheen, PA 10/03/23 1511

## 2023-10-03 NOTE — ED Provider Notes (Signed)
[place] EMERGENCY DEPARTMENT AT [facility] Provider Note   CSN: [id number] Arrival date & time: 10/03/23  1436     History  Chief Complaint  Patient presents with   Abdominal Pain    Theresa Arias is a 37 y.o. female presenting to emergency department with lower abdominal pain.  Patient reports she has had cramping lower abdominal pain for several months, typically worse after her menstrual cycle.  She has been having pain now for 2 weeks since the end of her menstrual cycle she says is unusual.  It is diffuse across her lower abdomen and cramping.  Denies nausea, vomiting, diarrhea, dysuria or vaginal discharge.  She has an upcoming appointment in December with OB/GYN  HPI     Home Medications Prior to Admission medications   Medication Sig Start Date End Date Taking? Authorizing Provider  ibuprofen (ADVIL) 600 MG tablet Take 1 tablet (600 mg total) by mouth every 6 (six) hours as needed for up to 30 doses. 10/03/23  Yes Ej Pinson, Kermit Balo, MD  diltiazem (CARDIZEM SR) 60 MG 12 hr capsule Take 1 capsule (60 mg total) by mouth 2 (two) times daily. 02/28/23   Grayce Sessions, NP      Allergies    Patient has no known allergies.    Review of Systems   Review of Systems  Physical Exam Updated Vital Signs BP (!) 176/112 (BP Location: Left Arm)   Pulse (!) 59   Temp 97.8 F (36.6 C) (Oral)   Resp 16   Ht 5\' 9"  (1.753 m)   Wt 79.4 kg   LMP 09/24/2023   SpO2 98%   BMI 25.84 kg/m  Physical Exam Constitutional:      General: She is not in acute distress. HENT:     Head: Normocephalic and atraumatic.  Eyes:     Conjunctiva/sclera: Conjunctivae normal.     Pupils: Pupils are equal, round, and reactive to light.  Cardiovascular:     Rate and Rhythm: Normal rate and regular rhythm.  Pulmonary:     Effort: Pulmonary effort is normal. No respiratory distress.  Abdominal:     General: There is no distension.     Tenderness: There is abdominal tenderness  in the right lower quadrant and suprapubic area.  Skin:    General: Skin is warm and dry.  Neurological:     General: No focal deficit present.     Mental Status: She is alert. Mental status is at baseline.  Psychiatric:        Mood and Affect: Mood normal.        Behavior: Behavior normal.     ED Results / Procedures / Treatments   Labs (all labs ordered are listed, but only abnormal results are displayed) Labs Reviewed  COMPREHENSIVE METABOLIC PANEL - Abnormal; Notable for the following components:      Result Value   Glucose, Bld 100 (*)    All other components within normal limits  URINALYSIS, ROUTINE W REFLEX MICROSCOPIC - Abnormal; Notable for the following components:   Ketones, ur 5 (*)    All other components within normal limits  CBC WITH DIFFERENTIAL/PLATELET  HCG, SERUM, QUALITATIVE    EKG None  Radiology CT ABDOMEN PELVIS W CONTRAST  Result Date: 10/03/2023 CLINICAL DATA:  Abdominal pain EXAM: CT ABDOMEN AND PELVIS WITH CONTRAST TECHNIQUE: Multidetector CT imaging of the abdomen and pelvis was performed using the standard protocol following bolus administration of intravenous contrast. RADIATION DOSE REDUCTION: This exam  was performed according to the departmental dose-optimization program which includes automated exposure control, adjustment of the mA and/or kV according to patient size and/or use of iterative reconstruction technique. CONTRAST:  OMNIPAQUE IOHEXOL 300 MG/ML  SOLN COMPARISON:  10/12/2004 FINDINGS: Lower chest: No acute abnormality Hepatobiliary: No focal hepatic abnormality. Gallbladder unremarkable. Diffuse low-density throughout the liver suggests early fatty infiltration. Pancreas: No focal abnormality or ductal dilatation. Spleen: No focal abnormality.  Normal size. Adrenals/Urinary Tract: No adrenal abnormality. No focal renal abnormality. No stones or hydronephrosis. Urinary bladder is unremarkable. Stomach/Bowel: Colonic diverticulosis. No  active diverticulitis. Stomach and small bowel decompressed, unremarkable. Vascular/Lymphatic: No evidence of aneurysm or adenopathy. Reproductive: Posterior uterine fibroid measuring 1.6 cm. No adnexal mass. Other: No free fluid or free air. Musculoskeletal: No acute bony abnormality. IMPRESSION: No acute findings in the abdomen or pelvis. Scattered colonic diverticulosis.  No active diverticulitis. Electronically Signed   By: Charlett Nose M.D.   On: 10/03/2023 20:02    Procedures Procedures    Medications Ordered in ED Medications  ketorolac (TORADOL) 30 MG/ML injection 30 mg (30 mg Intravenous Given 10/03/23 1742)  iohexol (OMNIPAQUE) 300 MG/ML solution 100 mL (100 mLs Intravenous Contrast Given 10/03/23 1759)    ED Course/ Medical Decision Making/ A&P Clinical Course as of 10/03/23 2356  Mon Oct 03, 2023  6045 The patient is no longer wanting to stay to await results of her CT scan.  She says her pain was significant better with Toradol and she is planning to go home and follow-up with OB/GYN and take her Motrin at home.  I did make clear to her that we have not had a radiologist review her CT scan, we have not been able to completely rule out any potential medical or surgical emergency.  She understands this risk and chooses to leave all the same. [MT]    Clinical Course User Index [MT] Ricky Gallery, Kermit Balo, MD                                 Medical Decision Making Amount and/or Complexity of Data Reviewed Labs: ordered. Radiology: ordered.  Risk Prescription drug management.   This patient presents to the ED with concern for lower abdominal pain, cramping pain. This involves an extensive number of treatment options, and is a complaint that carries with it a high risk of complications and morbidity.  The differential diagnosis includes diverticulitis versus colitis versus uterine fibroids versus pelvic pathology versus other  I ordered and personally interpreted labs.  The pertinent  results include: No emergent findings  I ordered imaging studies including CT abdomen pelvis I independently visualized and interpreted imaging which showed uterine fibroid, diverticulosis, no emergent findings I agree with the radiologist interpretation  I ordered medication including IV Toradol for pain  I have reviewed the patients home medicines and have made adjustments as needed  Test Considered: Low suspicion for acute ovarian torsion.  No indication for pelvic Doppler at this time  After the interventions noted above, I reevaluated the patient and found that they have: improved -significant improvement of her pain after Toradol   Dispostion:  Ultimately the patient left AGAINST MEDICAL ADVICE prior to the radiologist review of her CT scan.  I did not did not find any emergent medical conditions prior to her discharge. I had reviewed her CT scan and I did not see any emergent medical findings, other than uterine fibroids, which we  discussed at bedside prior to her departure.    She will still follow-up with OB as scheduled.  I recommended NSAIDS/motrin for her pain.        Final Clinical Impression(s) / ED Diagnoses Final diagnoses:  Lower abdominal pain  Hypertension, unspecified type    Rx / DC Orders ED Discharge Orders          Ordered    ibuprofen (ADVIL) 600 MG tablet  Every 6 hours PRN        10/03/23 1923              Terald Sleeper, MD 10/03/23 2358

## 2023-10-03 NOTE — ED Triage Notes (Signed)
Patient arrives ambulatory by POV c/o lower abdominal pain x 1 month. States pain is worse after menstrual cycle. States she has appt with gynecologist end of December. Denies any N/V/D.

## 2023-10-03 NOTE — Discharge Instructions (Addendum)
You have chosen to leave before your medical workup was finished in the ER.  You understand that by choosing to leave before completing a workup, you are at risk for missing a diagnosis, which can be serious or life-threatening.  If your symptoms are getting worse, you understand that you should return immediately to the hospital.

## 2023-10-04 ENCOUNTER — Telehealth (INDEPENDENT_AMBULATORY_CARE_PROVIDER_SITE_OTHER): Payer: Self-pay | Admitting: Primary Care

## 2023-10-04 ENCOUNTER — Ambulatory Visit (INDEPENDENT_AMBULATORY_CARE_PROVIDER_SITE_OTHER): Payer: Self-pay

## 2023-10-04 NOTE — Telephone Encounter (Signed)
  Chief Complaint: pelvic pain radiating to back Symptoms: constant pain Frequency: few months Pertinent Negatives: Patient denies vomiting, urinary pain, frequency or urgency,  Disposition: [] ED /[] Urgent Care (no appt availability in office) / [] Appointment(In office/virtual)/ []  [place] [facility]/ [] Home Care/ [] Refused Recommended Disposition /[] [place] [facility]/ [x]  Follow-up with PCP Additional Notes: pt had not picked up Ibuprofen- she was leaving to go get it at the end of the call. Advised pt to take it, wait 1 hour and if pain not better or worsens to call back and advised her that she can go to [facility] ED since it is down the road form her residence.  Reason for Disposition  [1] SEVERE pain (e.g., excruciating) AND [2] present > 1 hour  Answer Assessment - Initial Assessment Questions 1. LOCATION: "Where does it hurt?"      Right side 2. RADIATION: "Does the pain shoot anywhere else?" (e.g., chest, back)     Back  3. ONSET: "When did the pain begin?" (e.g., minutes, hours or days ago)      months 5. PATTERN "Does the pain come and go, or is it constant?"    - If it comes and goes: "How long does it last?" "Do you have pain now?"     (Note: Comes and goes means the pain is intermittent. It goes away completely between bouts.)    - If constant: "Is it getting better, staying the same, or getting worse?"      (Note: Constant means the pain never goes away completely; most serious pain is constant and gets worse.)      comstamt 6. SEVERITY: "How bad is the pain?"  (e.g., Scale 1-10; mild, moderate, or severe)    - MILD (1-3): Doesn't interfere with normal activities, abdomen soft and not tender to touch.     - MODERATE (4-7): Interferes with normal activities or awakens from sleep, abdomen tender to touch.     - SEVERE (8-10): Excruciating pain, doubled over, unable to do any normal activities.       severe  8. CAUSE: "What do you think is causing the stomach  pain?"     fibroid 9. RELIEVING/AGGRAVATING FACTORS: "What makes it better or worse?" (e.g., antacids, bending or twisting motion, bowel movement)     Toradol in ED helped 10. OTHER SYMPTOMS: "Do you have any other symptoms?" (e.g., back pain, diarrhea, fever, urination pain, vomiting)       no 11. PREGNANCY: "Is there any chance you are pregnant?" "When was your last menstrual period?"       N/a  Protocols used: Abdominal Pain - [facility]

## 2023-10-04 NOTE — Telephone Encounter (Signed)
Called pt. Pt took Ibuprofen 1 hour ago and now rates pain 4/10.

## 2023-10-04 NOTE — Telephone Encounter (Signed)
Pt has an appt with provider 10/05/23.  Will forward to provider for [facility]

## 2023-10-04 NOTE — Telephone Encounter (Signed)
Called to remind pt of apt.

## 2023-10-05 ENCOUNTER — Ambulatory Visit (INDEPENDENT_AMBULATORY_CARE_PROVIDER_SITE_OTHER): Payer: Medicaid Other | Admitting: Primary Care

## 2023-10-06 NOTE — Telephone Encounter (Signed)
noted 

## 2023-10-11 ENCOUNTER — Ambulatory Visit (INDEPENDENT_AMBULATORY_CARE_PROVIDER_SITE_OTHER): Payer: Medicaid Other | Admitting: Primary Care

## 2023-10-19 ENCOUNTER — Telehealth (INDEPENDENT_AMBULATORY_CARE_PROVIDER_SITE_OTHER): Payer: Self-pay | Admitting: Primary Care

## 2023-10-19 NOTE — Telephone Encounter (Signed)
Called pt to confirm pt will be at apt.

## 2023-10-20 ENCOUNTER — Ambulatory Visit (INDEPENDENT_AMBULATORY_CARE_PROVIDER_SITE_OTHER): Payer: Medicaid Other | Admitting: Primary Care

## 2023-10-20 DIAGNOSIS — Z1159 Encounter for screening for other viral diseases: Secondary | ICD-10-CM

## 2023-10-24 ENCOUNTER — Ambulatory Visit (INDEPENDENT_AMBULATORY_CARE_PROVIDER_SITE_OTHER): Payer: Medicaid Other | Admitting: Primary Care

## 2023-10-26 ENCOUNTER — Telehealth (INDEPENDENT_AMBULATORY_CARE_PROVIDER_SITE_OTHER): Payer: Medicaid Other | Admitting: Primary Care

## 2023-10-26 ENCOUNTER — Ambulatory Visit (INDEPENDENT_AMBULATORY_CARE_PROVIDER_SITE_OTHER): Payer: Medicaid Other | Admitting: Primary Care

## 2023-10-26 DIAGNOSIS — D259 Leiomyoma of uterus, unspecified: Secondary | ICD-10-CM | POA: Diagnosis not present

## 2023-10-26 DIAGNOSIS — D219 Benign neoplasm of connective and other soft tissue, unspecified: Secondary | ICD-10-CM

## 2023-10-26 MED ORDER — IBUPROFEN 600 MG PO TABS: 600.0000 mg | ORAL_TABLET | Freq: Three times a day (TID) | ORAL | 0 refills | Status: AC | PRN

## 2023-10-26 NOTE — Progress Notes (Deleted)
  [facility]  Telephone Note  I connected with Zollie Scale, on 10/26/2023 at 9:29 AM telephone and verified that I am speaking with the correct person using two identifiers.   Consent: I discussed the limitations, risks, security and privacy concerns of performing an evaluation and management service by telephone and the availability of in person appointments. I also discussed with the patient that there may be a patient responsible charge related to this service. The patient expressed understanding and agreed to proceed.   Location of Patient: Home  Location of Provider: [place] [facility]   Persons participating in Telemedicine visit: Kalman Drape,  NP   History of Present Illness: Theresa Arias is a 37 year old female lower abdominal pain typically worse after her menstrual cycle . Denies nausea, vomiting, diarrhea, dysuria or vaginal discharge . 7/10 pain radiating pain to side and back taking 600mg  ibuprofen.    Past Medical History:  Diagnosis Date   Hypertension    No Known Allergies  Current Outpatient Medications on File Prior to Visit  Medication Sig Dispense Refill   diltiazem (CARDIZEM SR) 60 MG 12 hr capsule Take 1 capsule (60 mg total) by mouth 2 (two) times daily. 180 capsule 1   ibuprofen (ADVIL) 600 MG tablet Take 1 tablet (600 mg total) by mouth every 6 (six) hours as needed for up to 30 doses. 30 tablet 0   No current facility-administered medications on file prior to visit.    Observations/Objective: LMP 09/24/2023    Assessment and Plan: ***  Follow Up Instructions: ***   I discussed the assessment and treatment plan with the patient. The patient was provided an opportunity to ask questions and all were answered. The patient agreed with the plan and demonstrated an understanding of the instructions.   The patient was advised to call back or seek an in-person  evaluation if the symptoms worsen or if the condition fails to improve as anticipated.     I provided *** minutes total of non-face-to-face time during this encounter including median intraservice time, reviewing previous notes, investigations, ordering medications, medical decision making, coordinating care and patient verbalized understanding at the end of the visit.    This note has been created with Education officer, environmental. Any transcriptional errors are unintentional.   Grayce Sessions, NP 10/26/2023, 9:29 AM

## 2023-10-26 NOTE — Progress Notes (Signed)
  [facility]  Telephone Note  I connected with Zollie Scale, on 10/26/2023 at 2:26 PM through telephone and verified that I am speaking with the correct person using two identifiers.   Consent: I discussed the limitations, risks, security and privacy concerns of performing an evaluation and management service by telephone and the availability of in person appointments. I also discussed with the patient that there may be a patient responsible charge related to this service. The patient expressed understanding and agreed to proceed.   Location of Patient: Home   Location of Provider: [place] [facility]   Persons participating in Telemedicine visit: Kalman Drape,  NP   History of Present Illness: Theresa Arias is a 37 year old female recently presented to ED for Lower abdominal pains   Patient stated she was having  cramping lower abdominal pain for several months, typically worse after her menstrual cycle. Today her concern was diffuse pain. CT scan uterine fibroids  and also discussed diverticulitis. Ibuprofen is providing some relief. Past Medical History:  Diagnosis Date   Hypertension    No Known Allergies  Current Outpatient Medications on File Prior to Visit  Medication Sig Dispense Refill   diltiazem (CARDIZEM SR) 60 MG 12 hr capsule Take 1 capsule (60 mg total) by mouth 2 (two) times daily. 180 capsule 1   ibuprofen (ADVIL) 600 MG tablet Take 1 tablet (600 mg total) by mouth every 6 (six) hours as needed for up to 30 doses. 30 tablet 0   No current facility-administered medications on file prior to visit.    Observations/Objective: LMP 09/24/2023    Assessment and Plan: Diagnoses and all orders for this visit:  Fibroids Comments: uterine   Keep schedule appt call GYN asked to add to wait list, Continue ibuprofen and would refill if/when needed  Follow Up Instructions:     I discussed the assessment and treatment plan with the patient. The patient was provided an opportunity to ask questions and all were answered. The patient agreed with the plan and demonstrated an understanding of the instructions.   The patient was advised to call back or seek an in-person evaluation if the symptoms worsen or if the condition fails to improve as anticipated.     I provided 20 minutes total of non-face-to-face time during this encounter including median intraservice time, reviewing previous notes, investigations, ordering medications, medical decision making, coordinating care and patient verbalized understanding at the end of the visit.    This note has been created with Education officer, environmental. Any transcriptional errors are unintentional.   Grayce Sessions, NP 10/26/2023, 2:26 PM

## 2023-10-26 NOTE — Patient Instructions (Signed)
Diverticulitis  Diverticulitis is when small pouches in your colon get infected or swollen. This causes pain in your belly (abdomen) and watery poop (diarrhea). The small pouches are called diverticula. They may form if you have a condition called diverticulosis. What are the causes? You may get this condition if poop (stool) gets trapped in the pouches in your colon. The poop lets germs (bacteria) grow. This causes an infection. What increases the risk? You are more likely to get this condition if you have small pouches in your colon. You are also more likely to get it if: You are overweight or very overweight (obese). You do not exercise enough. You drink alcohol. You smoke. You eat a lot of red meat, like beef, pork, or lamb. You do not eat enough fiber. You are older than 39 years of age. What are the signs or symptoms? Pain in your belly. Pain is often on the left side, but it may be felt in other spots too. Fever and chills. Feeling like you may vomit. Vomiting. Having cramps. Feeling full. Changes in how often you poop. Blood in your poop. How is this treated? Most cases are treated at home. You may be told to: Take over-the-counter pain medicines. Only eat and drink clear liquids. Take antibiotics. Rest. Very bad cases may need to be treated at a hospital. Treatment may include: Not eating or drinking. Taking pain medicines. Getting antibiotics through an IV tube. Getting fluid and food through an IV tube. Having surgery. When you are feeling better, you may need to have a test to look at your colon (colonoscopy). Follow these instructions at home: Medicines Take over-the-counter and prescription medicines only as told by your doctor. These include: Fiber pills. Probiotics. Medicines to make your poop soft (stool softeners). If you were prescribed antibiotics, take them as told by your doctor. Do not stop taking them even if you start to feel better. Ask your  doctor if you should avoid driving or using machines while you are taking your medicine. Eating and drinking  Follow the diet told by your doctor. You may need to only eat and drink liquids. When you feel better, you may be able to eat more foods. You may also be told to eat a lot of fiber. Fiber helps you poop. Foods with fiber include berries, beans, lentils, and green vegetables. Try not to eat red meat. General instructions Do not smoke or use any products that contain nicotine or tobacco. If you need help quitting, ask your doctor. Exercise 3 or more times a week. Try to go for 30 minutes each time. Exercise enough to sweat and make your heart beat faster. Contact a doctor if: Your pain gets worse. You are not pooping like normal. Your symptoms do not get better. Your symptoms get worse very fast. You have a fever. You vomit more than one time. You have poop that is: Bloody. Black. Tarry. This information is not intended to replace advice given to you by your health care provider. Make sure you discuss any questions you have with your health care provider. Document Revised: 08/12/2022 Document Reviewed: 08/12/2022 Elsevier Patient Education  2024 [employer].

## 2023-11-07 ENCOUNTER — Other Ambulatory Visit (INDEPENDENT_AMBULATORY_CARE_PROVIDER_SITE_OTHER): Payer: Self-pay | Admitting: Primary Care

## 2023-11-07 DIAGNOSIS — Z760 Encounter for issue of repeat prescription: Secondary | ICD-10-CM

## 2023-11-07 DIAGNOSIS — I10 Essential (primary) hypertension: Secondary | ICD-10-CM

## 2023-11-08 NOTE — Telephone Encounter (Signed)
Requested Prescriptions  Pending Prescriptions Disp Refills   diltiazem (CARDIZEM SR) 60 MG 12 hr capsule [Pharmacy Med Name: DILTIAZEM ER 60MG  CAPSULES (12 HR)] 180 capsule 1    Sig: TAKE 1 CAPSULE(60 MG) BY MOUTH TWICE DAILY     Cardiovascular: Calcium Channel Blockers 3 Failed - 11/07/2023  3:46 PM      Failed - Last BP in normal range    BP Readings from Last 1 Encounters:  10/03/23 (!) 176/112         Failed - Valid encounter within last 6 months    Recent Outpatient Visits           1 week ago Fibroids   [place] [facility] Grayce Sessions, NP   8 months ago Essential hypertension   [place] [facility] Grayce Sessions, NP   1 year ago Essential hypertension   [place] [facility] Grayce Sessions, NP   3 years ago Essential hypertension   [place] [facility] Grayce Sessions, NP   3 years ago Callous ulcer with fat layer exposed (HCC)   [place] [facility] Grayce Sessions, NP              Passed - ALT in normal range and within 360 days    ALT  Date Value Ref Range Status  10/03/2023 26 0 - 44 U/L Final         Passed - AST in normal range and within 360 days    AST  Date Value Ref Range Status  10/03/2023 18 15 - 41 U/L Final         Passed - Cr in normal range and within 360 days    Creatinine, Ser  Date Value Ref Range Status  10/03/2023 0.81 0.44 - 1.00 mg/dL Final         Passed - Last Heart Rate in normal range    Pulse Readings from Last 1 Encounters:  10/03/23 (!) 59

## 2023-11-24 ENCOUNTER — Other Ambulatory Visit (HOSPITAL_COMMUNITY)
Admission: RE | Admit: 2023-11-24 | Discharge: 2023-11-24 | Disposition: A | Discharge status: Home / Self Care | Payer: Medicaid Other | Source: Ambulatory Visit | Attending: Obstetrics and Gynecology | Admitting: Obstetrics and Gynecology

## 2023-11-24 ENCOUNTER — Ambulatory Visit: Payer: Medicaid Other | Admitting: Obstetrics and Gynecology

## 2023-11-24 ENCOUNTER — Encounter: Payer: Self-pay | Admitting: Obstetrics and Gynecology

## 2023-11-24 VITALS — BP 148/107 | HR 56 | Ht 68.0 in | Wt 175.0 lb

## 2023-11-24 DIAGNOSIS — Z1151 Encounter for screening for human papillomavirus (HPV): Secondary | ICD-10-CM | POA: Diagnosis not present

## 2023-11-24 DIAGNOSIS — Z113 Encounter for screening for infections with a predominantly sexual mode of transmission: Secondary | ICD-10-CM | POA: Insufficient documentation

## 2023-11-24 DIAGNOSIS — Z124 Encounter for screening for malignant neoplasm of cervix: Secondary | ICD-10-CM | POA: Diagnosis not present

## 2023-11-24 DIAGNOSIS — Z1339 Encounter for screening examination for other mental health and behavioral disorders: Secondary | ICD-10-CM

## 2023-11-24 DIAGNOSIS — N946 Dysmenorrhea, unspecified: Secondary | ICD-10-CM

## 2023-11-24 DIAGNOSIS — Z01419 Encounter for gynecological examination (general) (routine) without abnormal findings: Secondary | ICD-10-CM

## 2023-11-24 NOTE — Progress Notes (Signed)
GYNECOLOGY ANNUAL PREVENTATIVE CARE ENCOUNTER NOTE  History:     Theresa Arias is a 37 y.o. G28P5 female here for a routine annual gynecologic exam.  Current complaints: pain with menses.   Denies abnormal vaginal bleeding, discharge, problems with intercourse or other gynecologic concerns.   Pt notes regular menses. Pt notes increased pain with menses the last 2 months.  Pt also notes occasional pain with intercourse  and notes a family history of endometriosis.   Gynecologic History Patient's last menstrual period was 11/15/2023 (exact date). Contraception: tubal ligation Last Pap: > greater than 5 years Last mammogram: n/a  Obstetric History OB History  Gravida Para Term Preterm AB Living  5     5  SAB IAB Ectopic Multiple Live Births      5    # Outcome Date GA Lbr Len/2nd Weight Sex Type Anes PTL Lv  5 Gravida 05/17/19    F Vag-Spont   LIV  4 Gravida 12/27/13    M Vag-Spont   LIV  3 Gravida 07/22/09    F Vag-Spont   LIV  2 Gravida 06/26/06    M Vag-Spont   LIV  1 Gravida 02/26/04    M Vag-Spont   LIV    Past Medical History:  Diagnosis Date   Anemia    Hypertension     Past Surgical History:  Procedure Laterality Date   HERNIA REPAIR     TUBAL LIGATION      Current Outpatient Medications on File Prior to Visit  Medication Sig Dispense Refill   diltiazem (CARDIZEM SR) 60 MG 12 hr capsule TAKE 1 CAPSULE(60 MG) BY MOUTH TWICE DAILY 180 capsule 1   ibuprofen (ADVIL) 600 MG tablet Take 1 tablet (600 mg total) by mouth every 8 (eight) hours as needed for up to 90 doses for moderate pain (pain score 4-6). 90 tablet 0   No current facility-administered medications on file prior to visit.    No Known Allergies  Social History:  reports that she has been smoking cigarettes. She has never used smokeless tobacco. She reports current alcohol use. She reports that she does not use drugs.  Family History  Problem Relation Age of Onset   Hypertension Mother     Hypertension Father     The following portions of the patient's history were reviewed and updated as appropriate: allergies, current medications, past family history, past medical history, past social history, past surgical history and problem list.  Review of Systems Pertinent items noted in HPI and remainder of comprehensive ROS otherwise negative.  Physical Exam:  BP (!) 148/107 (BP Location: Left Arm, Cuff Size: Normal)   Pulse (!) 56   Ht 5\' 8"  (1.727 m)   Wt 175 lb (79.4 kg)   LMP 11/15/2023 (Exact Date)   BMI 26.61 kg/m  CONSTITUTIONAL: Well-developed, well-nourished female in no acute distress.  HENT:  Normocephalic, atraumatic, External right and left ear normal. Oropharynx is clear and moist EYES: Conjunctivae and EOM are normal.  NECK: Normal range of motion, supple, no masses.  Normal thyroid.  SKIN: Skin is warm and dry. No rash noted. Not diaphoretic. No erythema. No pallor. MUSCULOSKELETAL: Normal range of motion. No tenderness.  No cyanosis, clubbing, or edema.  2+ distal pulses. NEUROLOGIC: Alert and oriented to person, place, and time. Normal reflexes, muscle tone coordination.  PSYCHIATRIC: Normal mood and affect. Normal behavior. Normal judgment and thought content. CARDIOVASCULAR: Normal heart rate noted, regular rhythm RESPIRATORY: Clear to auscultation bilaterally.  Effort and breath sounds normal, no problems with respiration noted. BREASTS: Symmetric in size. No masses, tenderness, skin changes, nipple drainage, or lymphadenopathy bilaterally. Performed in the presence of a chaperone. ABDOMEN: Soft, no distention noted.  No tenderness, rebound or guarding.  PELVIC: Normal appearing external genitalia and urethral meatus; normal appearing vaginal mucosa and cervix.  No abnormal discharge noted.  Pap smear obtained. Vaginal swab taken  Normal uterine size, no other palpable masses, no uterine or adnexal tenderness.  Performed in the presence of a chaperone.    Assessment and Plan:    1. Women's annual routine gynecological examination [Z01.419] (Primary) Normal annual exam - Cytology - PAP( [facility])  2. Routine screening for STI (sexually transmitted infection) Per pt request - Cervicovaginal ancillary only( [place]) - HIV antibody (with reflex) - Hepatitis C Antibody - Hepatitis B Surface AntiGEN - [facility]  3. Dysmenorrhea Pt notes some decrease in pain with NSAIDs. CT scan did not show any major abnormality If pain persists in 2-3 months, pt is to contact MD to discuss possible diagnostic laparoscopy  Will follow up results of pap smear and manage accordingly. Routine preventative health maintenance measures emphasized. Please refer to After Visit Summary for other counseling recommendations.      Mariel Aloe, MD, FACOG Obstetrician & Gynecologist, [facility], [facility]

## 2023-11-24 NOTE — Progress Notes (Signed)
37 y.o. New GYN presents for AEX/PAP/STD screening.  C/o painful periods 10/10 x 2+ months, it hurts more after her periods 8/10.Marland Kitchen

## 2023-11-25 ENCOUNTER — Ambulatory Visit (INDEPENDENT_AMBULATORY_CARE_PROVIDER_SITE_OTHER): Payer: Self-pay

## 2023-11-25 LAB — CERVICOVAGINAL ANCILLARY ONLY
Bacterial Vaginitis (gardnerella): POSITIVE — AB
Candida Glabrata: NEGATIVE
Candida Vaginitis: NEGATIVE
Chlamydia: NEGATIVE
Comment: NEGATIVE
Comment: NEGATIVE
Comment: NEGATIVE
Comment: NEGATIVE
Comment: NEGATIVE
Comment: NORMAL
Neisseria Gonorrhea: NEGATIVE
Trichomonas: POSITIVE — AB

## 2023-11-25 LAB — RPR: RPR Ser Ql: NONREACTIVE

## 2023-11-25 LAB — HIV ANTIBODY (ROUTINE TESTING W REFLEX): HIV Screen 4th Generation wRfx: NONREACTIVE

## 2023-11-25 LAB — HEPATITIS B SURFACE ANTIGEN: Hepatitis B Surface Ag: NEGATIVE

## 2023-11-25 LAB — HEPATITIS C ANTIBODY: Hep C Virus Ab: NONREACTIVE

## 2023-11-25 NOTE — Telephone Encounter (Signed)
  Chief Complaint: pelvic pain Symptoms: pelvic pain, constant, 8/10 Frequency: several months  Pertinent Negatives: Patient denies vaginal bleeding or urinary problems Disposition: [] ED /[] Urgent Care (no appt availability in office) / [] Appointment(In office/virtual)/ []  [facility]/ [] Home Care/ [] Refused Recommended Disposition /[] [facility]/ []  Follow-up with PCP Additional Notes: pt states went to ED back in November 2024 all tests came back normal, was told fibroids shouldn't be causing pain so had full work up yesterday with OBGYN for pap and cervical cancer screening d/t pt was told could be possible endometriosis, reviewed chart and seen where vaginal swab was positive for BV and Trich, advised pt to call GYN office to see if they were going to prescribe abx for results. Pt states she will call them now. No further assistance noted.   Reason for Disposition  [1] SEVERE pelvic pain AND [2] present > 1 hour  Answer Assessment - Initial Assessment Questions 1. LOCATION: "Where does it hurt?"      Middle of pelvis  2. RADIATION: "Does the pain shoot anywhere else?" (e.g., lower back, groin, thighs)     Back  3. ONSET: "When did the pain begin?" (e.g., minutes, hours or days ago)      Several months  5. PATTERN "Does the pain come and go, or is it constant?"    - If constant: "Is it getting better, staying the same, or worsening?"      (Note: Constant means the pain never goes away completely; most serious pain is constant and gets worse over time)     - If intermittent: "How long does it last?" "Do you have pain now?"     (Note: Intermittent means the pain goes away completely between bouts)     constant 6. SEVERITY: "How bad is the pain?"  (e.g., Scale 1-10; mild, moderate, or severe)   - MILD (1-3): doesn't interfere with normal activities, area soft and not tender to touch    - MODERATE (4-7): interferes with normal activities or awakens from sleep, abdomen  tender to touch    - SEVERE (8-10): excruciating pain, doubled over, unable to do any normal activities      8/10 8. CAUSE: "What do you think is causing the pelvic pain?"     Possible endometriosis  9. RELIEVING/AGGRAVATING FACTORS: "What makes it better or worse?" (e.g., activity/rest, sexual intercourse, voiding, passing stool)     Heat and movement  10. OTHER SYMPTOMS: "Has there been any other symptoms?" (e.g., fever, constipation, diarrhea, urine problems, vaginal bleeding, vaginal discharge, or vomiting?"       no  Protocols used: Pelvic Pain - Female-A-AH

## 2023-11-28 ENCOUNTER — Other Ambulatory Visit: Payer: Self-pay | Admitting: *Deleted

## 2023-11-28 ENCOUNTER — Encounter: Payer: Self-pay | Admitting: Obstetrics and Gynecology

## 2023-11-28 MED ORDER — METRONIDAZOLE 500 MG PO TABS: 500.0000 mg | ORAL_TABLET | Freq: Two times a day (BID) | ORAL | 0 refills | Status: AC

## 2023-11-28 NOTE — Progress Notes (Signed)
Flagyl sent for +BV/Trich Pt aware.

## 2023-12-02 LAB — CYTOLOGY - PAP
Comment: NEGATIVE
Diagnosis: NEGATIVE
Diagnosis: REACTIVE
High risk HPV: NEGATIVE

## 2023-12-06 ENCOUNTER — Telehealth (INDEPENDENT_AMBULATORY_CARE_PROVIDER_SITE_OTHER): Payer: Medicaid Other | Admitting: Primary Care

## 2024-01-28 ENCOUNTER — Telehealth: Admitting: Physician Assistant

## 2024-01-28 DIAGNOSIS — R6889 Other general symptoms and signs: Secondary | ICD-10-CM

## 2024-01-29 ENCOUNTER — Telehealth: Admitting: Physician Assistant

## 2024-01-29 DIAGNOSIS — J111 Influenza due to unidentified influenza virus with other respiratory manifestations: Secondary | ICD-10-CM | POA: Diagnosis not present

## 2024-01-29 MED ORDER — BENZONATATE 100 MG PO CAPS: ORAL_CAPSULE | ORAL | 0 refills | Status: AC

## 2024-01-29 MED ORDER — ONDANSETRON 4 MG PO TBDP: 4.0000 mg | ORAL_TABLET | Freq: Three times a day (TID) | ORAL | 0 refills | Status: AC | PRN

## 2024-01-29 MED ORDER — OSELTAMIVIR PHOSPHATE 75 MG PO CAPS: 75.0000 mg | ORAL_CAPSULE | Freq: Two times a day (BID) | ORAL | 0 refills | Status: AC

## 2024-01-29 NOTE — Progress Notes (Signed)
 E visit for Flu like symptoms   We are sorry that you are not feeling well.  Here is how we plan to help! Based on what you have shared with me it looks like you may have flu-like symptoms that should be watched but do not seem to indicate anti-viral treatment.  Influenza or "the flu" is   an infection caused by a respiratory virus. The flu virus is highly contagious and persons who did not receive their yearly flu vaccination may "catch" the flu from close contact.  We have anti-viral medications to treat the viruses that cause this infection. They are not a "cure" and only shorten the course of the infection. These prescriptions are most effective when they are given within the first 2 days of "flu" symptoms. Antiviral medication are indicated if you have a high risk of complications from the flu. You should  also consider an antiviral medication if you are in close contact with someone who is at risk. These medications can help patients avoid complications from the flu  but have side effects that you should know. Possible side effects from Tamiflu or oseltamivir include nausea, vomiting, diarrhea, dizziness, headaches, eye redness, sleep problems or other respiratory symptoms. You should not take Tamiflu if you have an allergy to oseltamivir or any to the ingredients in Tamiflu.  Based upon your symptoms and potential risk factors I recommend that you follow the flu symptoms recommendation that I have listed below.  ANYONE WHO HAS FLU SYMPTOMS SHOULD: Stay home. The flu is highly contagious and going out or to work exposes others! Be sure to drink plenty of fluids. Water is fine as well as fruit juices, sodas and electrolyte beverages. You may want to stay away from caffeine or alcohol. If you are nauseated, try taking small sips of liquids. How do you know if you are getting enough fluid? Your urine should be a pale yellow or almost colorless. Get rest. Taking a steamy shower or using a  humidifier may help nasal congestion and ease sore throat pain. Using a saline nasal spray works much the same way. Cough drops, hard candies and sore throat lozenges may ease your cough. Line up a caregiver. Have someone check on you regularly.   GET HELP RIGHT AWAY IF: You cannot keep down liquids or your medications. You become short of breath Your fell like you are going to pass out or loose consciousness. Your symptoms persist after you have completed your treatment plan MAKE SURE YOU  Understand these instructions. Will watch your condition. Will get help right away if you are not doing well or get worse.  Your e-visit answers were reviewed by a board certified advanced clinical practitioner to complete your personal care plan.  Depending on the condition, your plan could have included both over the counter or prescription medications.  If there is a problem please reply  once you have received a response from your provider.  Your safety is important to [place].  If you have drug allergies check your prescription carefully.    You can use MyChart to ask questions about today's visit, request a non-urgent call back, or ask for a work or school excuse for 24 hours related to this e-Visit. If it has been greater than 24 hours you will need to follow up with your provider, or enter a new e-Visit to address those concerns.  You will get an e-mail in the next two days asking about your experience.  I hope that  your e-visit has been valuable and will speed your recovery. Thank you for using e-visits.  I have spent 5 minutes in review of e-visit questionnaire, review and updating patient chart, medical decision making and response to patient.   Kasandra Knudsen Mayers, PA-C

## 2024-01-29 NOTE — Patient Instructions (Signed)
 Theresa Arias, thank you for joining Roney Jaffe, PA-C for today's virtual visit.  While this provider is not your primary care provider (PCP), if your PCP is located in our provider database this encounter information will be shared with them immediately following your visit.   A [facility] MyChart account gives you access to today's visit and all your visits, tests, and labs performed at [facility] " click here if you don't have a [facility] MyChart account or go to [email or web address]  Consent: (Patient) Theresa Arias provided verbal consent for this virtual visit at the beginning of the encounter.  Current Medications:  Current Outpatient Medications:    benzonatate (TESSALON) 100 MG capsule, Take 1-2 caps PO TID PRN, Disp: 20 capsule, Rfl: 0   ondansetron (ZOFRAN-ODT) 4 MG disintegrating tablet, Take 1 tablet (4 mg total) by mouth every 8 (eight) hours as needed for nausea or vomiting., Disp: 10 tablet, Rfl: 0   oseltamivir (TAMIFLU) 75 MG capsule, Take 1 capsule (75 mg total) by mouth 2 (two) times daily for 5 days., Disp: 10 capsule, Rfl: 0   diltiazem (CARDIZEM SR) 60 MG 12 hr capsule, TAKE 1 CAPSULE(60 MG) BY MOUTH TWICE DAILY, Disp: 180 capsule, Rfl: 1   ibuprofen (ADVIL) 600 MG tablet, Take 1 tablet (600 mg total) by mouth every 8 (eight) hours as needed for up to 90 doses for moderate pain (pain score 4-6)., Disp: 90 tablet, Rfl: 0   metroNIDAZOLE (FLAGYL) 500 MG tablet, Take 1 tablet (500 mg total) by mouth 2 (two) times daily., Disp: 14 tablet, Rfl: 0   Medications ordered in this encounter:  Meds ordered this encounter  Medications   oseltamivir (TAMIFLU) 75 MG capsule    Sig: Take 1 capsule (75 mg total) by mouth 2 (two) times daily for 5 days.    Dispense:  10 capsule    Refill:  0    Supervising Provider:   Merrilee Jansky [[id number]]   ondansetron (ZOFRAN-ODT) 4 MG disintegrating tablet    Sig: Take 1 tablet (4 mg total) by mouth every 8  (eight) hours as needed for nausea or vomiting.    Dispense:  10 tablet    Refill:  0    Supervising Provider:   Merrilee Jansky [[id number]]   benzonatate (TESSALON) 100 MG capsule    Sig: Take 1-2 caps PO TID PRN    Dispense:  20 capsule    Refill:  0    Supervising Provider:   Merrilee Jansky [[id number]]     *If you need refills on other medications prior to your next appointment, please contact your pharmacy*  Follow-Up: Call back or seek an in-person evaluation if the symptoms worsen or if the condition fails to improve as anticipated.  [facility] [phone]  Other Instructions Influenza, Adult Influenza is also called the flu. It's an infection that affects your respiratory tract. This includes your nose, throat, windpipe, and lungs. The flu is contagious. This means it spreads easily from person to person. It causes symptoms that are like a cold. It can also cause a high fever and body aches. What are the causes? The flu is caused by the influenza virus. You can get it by: Breathing in droplets that are in the air after an infected person coughs or sneezes. Touching something that has the virus on it and then touching your mouth, nose, or eyes. What increases the risk? You may be more likely to get the flu  if: You don't wash your hands often. You're near a lot of people during cold and flu season. You touch your mouth, eyes, or nose without washing your hands first. You don't get a flu shot each year. You may also be more at risk for the flu and serious problems, such as a lung infection called pneumonia, if: You're older than 65. You're pregnant. Your immune system is weak. Your immune system is your body's defense system. You have a long-term, or chronic, condition, such as: Heart, kidney, or lung disease. Diabetes. A liver disorder. Asthma. You're very overweight. You have anemia. This is when you don't have enough red blood cells in your  body. What are the signs or symptoms? Flu symptoms often start all of a sudden. They may last 4-14 days and include: Fever and chills. Headaches, body aches, or muscle aches. Sore throat. Cough. Runny or stuffy nose. Discomfort in your chest. Not wanting to eat as much as normal. Feeling weak or tired. Feeling dizzy. Nausea or vomiting. How is this diagnosed? The flu may be diagnosed based on your symptoms and medical history. You may also have a physical exam. A swab may be taken from your nose or throat and tested for the virus. How is this treated? If the flu is found early, you can be treated with antiviral medicine. This may be given to you by mouth or through an IV. It can help you feel less sick and get better faster. Taking care of yourself at home can also help your symptoms get better. Your health care provider may tell you to: Take over-the-counter medicines. Drink lots of fluids. The flu often goes away on its own. If you have very bad symptoms or problems caused by the flu, you may need to be treated in a hospital. Follow these instructions at home: Activity Rest as needed. Get lots of sleep. Stay home from work or school as told by your provider. Leave home only to go see your provider. Do not leave home for other reasons until you don't have a fever for 24 hours without taking medicine. Eating and drinking Take an oral rehydration solution (ORS). This is a drink that is sold at pharmacies and stores. Drink enough fluid to keep your pee pale yellow. Try to drink small amounts of clear fluids. These include water, ice chips, fruit juice mixed with water, and low-calorie sports drinks. Try to eat bland foods that are easy to digest. These include bananas, applesauce, rice, lean meats, toast, and crackers. Avoid drinks that have a lot of sugar or caffeine in them. These include energy drinks, regular sports drinks, and soda. Do not drink alcohol. Do not eat spicy or fatty  foods. General instructions     Take your medicines only as told by your provider. Use a cool mist humidifier to add moisture to the air in your home. This can make it easier for you to breathe. You should also clean the humidifier every day. To do so: Empty the water. Pour clean water in. Cover your mouth and nose when you cough or sneeze. Wash your hands with soap and water often and for at least 20 seconds. It's extra important to do so after you cough or sneeze. If you can't use soap and water, use hand sanitizer. How is this prevented?  Get a flu shot every year. Ask your provider when you should get your flu shot. Stay away from people who are sick during fall and winter. Fall and  winter are cold and flu season. Contact a health care provider if: You get new symptoms. You have chest pain. You have watery poop, also called diarrhea. You have a fever. Your cough gets worse. You start to have more mucus. You feel like you may vomit, or you vomit. Get help right away if: You become short of breath or have trouble breathing. Your skin or nails turn blue. You have very bad pain or stiffness in your neck. You get a sudden headache or pain in your face or ear. You vomit each time you eat or drink. These symptoms may be an emergency. Call 911 right away. Do not wait to see if the symptoms will go away. Do not drive yourself to the hospital. This information is not intended to replace advice given to you by your health care provider. Make sure you discuss any questions you have with your health care provider. Document Revised: 08/18/2023 Document Reviewed: 12/23/2022 Elsevier Patient Education  2024 Elsevier Inc.   If you have been instructed to have an in-person evaluation today at a local Urgent Care facility, please use the link below. It will take you to a list of all of our available [facility], including address, phone number and hours of operation. Please do not  delay care.  [facility]  If you or a family member do not have a primary care provider, use the link below to schedule a visit and establish care. When you choose a [facility] primary care physician or advanced practice provider, you gain a long-term partner in health. Find a Primary Care Provider  Learn more about [facility]'s in-office and virtual care options: [facility] - Get Care Now

## 2024-01-29 NOTE — Progress Notes (Signed)
 Virtual Visit Consent   Theresa Arias, you are scheduled for a virtual visit with a [facility] provider today. Just as with appointments in the office, your consent must be obtained to participate. Your consent will be active for this visit and any virtual visit you may have with one of our providers in the next 365 days. If you have a MyChart account, a copy of this consent can be sent to you electronically.  As this is a virtual visit, video technology does not allow for your provider to perform a traditional examination. This may limit your provider's ability to fully assess your condition. If your provider identifies any concerns that need to be evaluated in person or the need to arrange testing (such as labs, EKG, etc.), we will make arrangements to do so. Although advances in technology are sophisticated, we cannot ensure that it will always work on either your end or our end. If the connection with a video visit is poor, the visit may have to be switched to a telephone visit. With either a video or telephone visit, we are not always able to ensure that we have a secure connection.  By engaging in this virtual visit, you consent to the provision of healthcare and authorize for your insurance to be billed (if applicable) for the services provided during this visit. Depending on your insurance coverage, you may receive a charge related to this service.  I need to obtain your verbal consent now. Are you willing to proceed with your visit today? Theresa Arias has provided verbal consent on 01/29/2024 for a virtual visit (video or telephone). Theresa Jaffe, PA-C  Date: 01/29/2024 8:49 AM   Virtual Visit via Video Note   I, Theresa Arias, connected with  Theresa Arias  ([id number], 10/31/1986) on 01/29/24 at  8:45 AM EST by a video-enabled telemedicine application and verified that I am speaking with the correct person using two identifiers.  Location: Patient: Virtual Visit Location  Patient: Home Provider: Virtual Visit Location Provider: Home Office   I discussed the limitations of evaluation and management by telemedicine and the availability of in person appointments. The patient expressed understanding and agreed to proceed.    History of Present Illness: Theresa Arias is a 38 y.o. who identifies as a female who was assigned female at birth,  presents with a one-day history of flu-like symptoms. She reports a sore throat, heavy chest, body aches, left ear pain, and neck pain. She also has a fever of 99.82F. She has been taking 600mg  of ibuprofen every eight hours for symptom relief, but reports minimal improvement. She also reports nausea and vomiting yesterday. She has not been eating well due to lack of appetite, but has been trying to stay hydrated. She tested negative for COVID-19 at home.  Problems:  Patient Active Problem List   Diagnosis Date Noted   Essential hypertension 10/11/2016    Allergies: No Known Allergies Medications:  Current Outpatient Medications:    benzonatate (TESSALON) 100 MG capsule, Take 1-2 caps PO TID PRN, Disp: 20 capsule, Rfl: 0   ondansetron (ZOFRAN-ODT) 4 MG disintegrating tablet, Take 1 tablet (4 mg total) by mouth every 8 (eight) hours as needed for nausea or vomiting., Disp: 10 tablet, Rfl: 0   oseltamivir (TAMIFLU) 75 MG capsule, Take 1 capsule (75 mg total) by mouth 2 (two) times daily for 5 days., Disp: 10 capsule, Rfl: 0   diltiazem (CARDIZEM SR) 60 MG 12 hr capsule, TAKE 1 CAPSULE(60 MG) BY  MOUTH TWICE DAILY, Disp: 180 capsule, Rfl: 1   ibuprofen (ADVIL) 600 MG tablet, Take 1 tablet (600 mg total) by mouth every 8 (eight) hours as needed for up to 90 doses for moderate pain (pain score 4-6)., Disp: 90 tablet, Rfl: 0   metroNIDAZOLE (FLAGYL) 500 MG tablet, Take 1 tablet (500 mg total) by mouth 2 (two) times daily., Disp: 14 tablet, Rfl: 0  Observations/Objective: Patient is well-developed, well-nourished in no acute  distress.  Resting comfortably  at home.  Head is normocephalic, atraumatic.  No labored breathing.  Speech is clear and coherent with logical content.  Patient is alert and oriented at baseline.    Assessment and Plan: 1. Influenza (Primary) - oseltamivir (TAMIFLU) 75 MG capsule; Take 1 capsule (75 mg total) by mouth 2 (two) times daily for 5 days.  Dispense: 10 capsule; Refill: 0 - ondansetron (ZOFRAN-ODT) 4 MG disintegrating tablet; Take 1 tablet (4 mg total) by mouth every 8 (eight) hours as needed for nausea or vomiting.  Dispense: 10 tablet; Refill: 0 - benzonatate (TESSALON) 100 MG capsule; Take 1-2 caps PO TID PRN  Dispense: 20 capsule; Refill: 0  Symptoms of sore throat, body aches, left ear pain, neck pain, and fever started yesterday. Negative home COVID test. Discussed the benefits of Tamiflu in reducing the duration and severity of symptoms. - Trial Tamiflu, Tessalon Perles and Zofran -Continue ibuprofen 600mg  every 8 hours. -Add Tylenol for additional symptom relief. Requested work excuse due to illness. -Provide work excuse for return on Tuesday.   Follow Up Instructions: I discussed the assessment and treatment plan with the patient. The patient was provided an opportunity to ask questions and all were answered. The patient agreed with the plan and demonstrated an understanding of the instructions.  A copy of instructions were sent to the patient via MyChart unless otherwise noted below.     The patient was advised to call back or seek an in-person evaluation if the symptoms worsen or if the condition fails to improve as anticipated.    Theresa Knudsen Mayers, PA-C

## 2024-01-30 ENCOUNTER — Telehealth (INDEPENDENT_AMBULATORY_CARE_PROVIDER_SITE_OTHER): Payer: Self-pay | Admitting: Primary Care

## 2024-01-30 NOTE — Telephone Encounter (Signed)
 Copied from CRM [id number]. Topic: General - Other >> Jan 30, 2024  1:19 PM Theresa Arias wrote: Reason for CRM: Patient is requesting a work excuse due to her being diagnosed with the flu for dates January 28, 2024 until January 31, 2024. Patient stated she still feels bad but didn't want to talk with a nurse she said she'Arias continue taking her medicine and if it get's worse she'll call back.

## 2024-01-31 NOTE — Telephone Encounter (Signed)
 Copied from CRM [id number]. Topic: Medical Record Request - Other >> Jan 31, 2024  1:16 PM Martha Clan wrote: Reason for CRM: Patient is requesting a letter for work. Patient went in to see her PCP on the 2nd of march and still has not recovered enough to attend work.

## 2024-05-21 ENCOUNTER — Telehealth (INDEPENDENT_AMBULATORY_CARE_PROVIDER_SITE_OTHER): Payer: Self-pay | Admitting: Primary Care

## 2024-05-22 ENCOUNTER — Telehealth (INDEPENDENT_AMBULATORY_CARE_PROVIDER_SITE_OTHER): Payer: Self-pay | Admitting: Primary Care

## 2024-05-22 DIAGNOSIS — Z760 Encounter for issue of repeat prescription: Secondary | ICD-10-CM

## 2024-05-22 DIAGNOSIS — I10 Essential (primary) hypertension: Secondary | ICD-10-CM

## 2024-05-22 NOTE — Telephone Encounter (Signed)
 Copied from CRM [id number]. Topic: Clinical - Medication Refill >> May 21, 2024  1:57 PM Zebedee SAUNDERS wrote: Medication: diltiazem  (CARDIZEM  SR) 60 MG 12 hr capsule  Has the patient contacted their pharmacy? Yes (Agent: If no, request that the patient contact the pharmacy for the refill. If patient does not wish to contact the pharmacy document the reason why and proceed with request.) (Agent: If yes, when and what did the pharmacy advise?)  This is the patient's preferred pharmacy:  [facility] [phone] - [place], [place] - [address] AT [facility] [address] [place] [address] Phone: [phone] Fax: [phone]  Is this the correct pharmacy for this prescription? Yes If no, delete pharmacy and type the correct one.   Has the prescription been filled recently? Yes  Is the patient out of the medication? Yes  Has the patient been seen for an appointment in the last year OR does the patient have an upcoming appointment? Yes  Can we respond through MyChart? Yes  Agent: Please be advised that Rx refills may take up to 3 business days. We ask that you follow-up with your pharmacy.

## 2024-05-22 NOTE — Telephone Encounter (Signed)
 This encounter was created in error - please disregard.

## 2024-05-23 MED ORDER — DILTIAZEM HCL ER 60 MG PO CP12: 60.0000 mg | ORAL_CAPSULE | Freq: Two times a day (BID) | ORAL | 0 refills | Status: AC

## 2024-05-23 NOTE — Telephone Encounter (Signed)
 Requested Prescriptions  Pending Prescriptions Disp Refills   diltiazem  (CARDIZEM  SR) 60 MG 12 hr capsule 60 capsule 0    Sig: Take 1 capsule (60 mg total) by mouth 2 (two) times daily.     Cardiovascular: Calcium Channel Blockers 3 Failed - 05/23/2024 11:00 AM      Failed - Last BP in normal range    BP Readings from Last 1 Encounters:  11/24/23 (!) 148/107         Failed - Valid encounter within last 6 months    Recent Outpatient Visits           7 months ago Fibroids   [place] [facility] Celestia Rosaline SQUIBB, NP   1 year ago Essential hypertension   [place] [facility] Celestia Rosaline SQUIBB, NP   2 years ago Essential hypertension   [place] [facility] Celestia Rosaline SQUIBB, NP   4 years ago Essential hypertension   [place] [facility] Celestia Rosaline SQUIBB, NP   4 years ago Callous ulcer with fat layer exposed (HCC)   [place] [facility] Celestia Rosaline SQUIBB, NP              Passed - ALT in normal range and within 360 days    ALT  Date Value Ref Range Status  10/03/2023 26 0 - 44 U/L Final         Passed - AST in normal range and within 360 days    AST  Date Value Ref Range Status  10/03/2023 18 15 - 41 U/L Final         Passed - Cr in normal range and within 360 days    Creatinine, Ser  Date Value Ref Range Status  10/03/2023 0.81 0.44 - 1.00 mg/dL Final         Passed - Last Heart Rate in normal range    Pulse Readings from Last 1 Encounters:  11/24/23 (!) 56

## 2024-06-20 ENCOUNTER — Other Ambulatory Visit: Payer: Self-pay | Admitting: Medical Genetics

## 2024-08-27 ENCOUNTER — Ambulatory Visit (INDEPENDENT_AMBULATORY_CARE_PROVIDER_SITE_OTHER): Admitting: Primary Care

## 2024-09-09 ENCOUNTER — Other Ambulatory Visit: Payer: Self-pay | Admitting: Medical Genetics

## 2024-09-09 DIAGNOSIS — Z006 Encounter for examination for normal comparison and control in clinical research program: Secondary | ICD-10-CM

## 2024-09-18 ENCOUNTER — Ambulatory Visit (INDEPENDENT_AMBULATORY_CARE_PROVIDER_SITE_OTHER): Admitting: Primary Care

## 2024-11-20 ENCOUNTER — Ambulatory Visit (INDEPENDENT_AMBULATORY_CARE_PROVIDER_SITE_OTHER): Admitting: Primary Care

## 2024-11-23 ENCOUNTER — Ambulatory Visit (INDEPENDENT_AMBULATORY_CARE_PROVIDER_SITE_OTHER): Admitting: Primary Care
